# Patient Record
Sex: Female | Born: 1950
Health system: Southern US, Community
[De-identification: ages and names within clinical notes are randomized; demographics above are authoritative.]

## PROBLEM LIST (undated history)

## (undated) DIAGNOSIS — E785 Hyperlipidemia, unspecified: Secondary | ICD-10-CM

## (undated) DIAGNOSIS — F32A Depression, unspecified: Secondary | ICD-10-CM

## (undated) DIAGNOSIS — N951 Menopausal and female climacteric states: Secondary | ICD-10-CM

## (undated) DIAGNOSIS — F329 Major depressive disorder, single episode, unspecified: Secondary | ICD-10-CM

## (undated) DIAGNOSIS — F419 Anxiety disorder, unspecified: Secondary | ICD-10-CM

## (undated) DIAGNOSIS — I82409 Acute embolism and thrombosis of unspecified deep veins of unspecified lower extremity: Secondary | ICD-10-CM

## (undated) HISTORY — DX: Acute embolism and thrombosis of unspecified deep veins of unspecified lower extremity: I82.409

## (undated) HISTORY — DX: Depression, unspecified: F32.A

## (undated) HISTORY — DX: Hyperlipidemia, unspecified: E78.5

## (undated) HISTORY — PX: ABDOMINAL HYSTERECTOMY: SHX81

## (undated) HISTORY — PX: CYSTECTOMY: SUR359

## (undated) HISTORY — DX: Menopausal and female climacteric states: N95.1

## (undated) HISTORY — DX: Anxiety disorder, unspecified: F41.9

## (undated) HISTORY — DX: Major depressive disorder, single episode, unspecified: F32.9

## (undated) HISTORY — PX: LAPAROSCOPIC OVARIAN CYSTECTOMY: SUR786

---

## 2003-05-10 HISTORY — PX: COLONOSCOPY: SHX174

## 2005-10-25 ENCOUNTER — Ambulatory Visit: Payer: Self-pay | Admitting: Family Medicine

## 2005-12-05 ENCOUNTER — Ambulatory Visit: Payer: Self-pay | Admitting: Family Medicine

## 2005-12-06 ENCOUNTER — Ambulatory Visit: Payer: Self-pay | Admitting: Family Medicine

## 2006-01-03 ENCOUNTER — Ambulatory Visit: Payer: Self-pay | Admitting: Family Medicine

## 2006-01-05 ENCOUNTER — Ambulatory Visit: Payer: Self-pay | Admitting: *Deleted

## 2006-02-27 ENCOUNTER — Ambulatory Visit: Payer: Self-pay | Admitting: Family Medicine

## 2006-04-11 ENCOUNTER — Ambulatory Visit: Payer: Self-pay | Admitting: Family Medicine

## 2006-08-24 ENCOUNTER — Encounter (INDEPENDENT_AMBULATORY_CARE_PROVIDER_SITE_OTHER): Payer: Self-pay | Admitting: Family Medicine

## 2006-08-24 ENCOUNTER — Ambulatory Visit: Payer: Self-pay | Admitting: Family Medicine

## 2007-01-24 ENCOUNTER — Encounter (INDEPENDENT_AMBULATORY_CARE_PROVIDER_SITE_OTHER): Payer: Self-pay | Admitting: *Deleted

## 2008-03-25 ENCOUNTER — Encounter: Payer: Self-pay | Admitting: Internal Medicine

## 2008-06-05 ENCOUNTER — Ambulatory Visit: Payer: Self-pay | Admitting: Internal Medicine

## 2008-06-05 DIAGNOSIS — E785 Hyperlipidemia, unspecified: Secondary | ICD-10-CM

## 2008-06-05 DIAGNOSIS — E1169 Type 2 diabetes mellitus with other specified complication: Secondary | ICD-10-CM | POA: Insufficient documentation

## 2008-06-05 DIAGNOSIS — N951 Menopausal and female climacteric states: Secondary | ICD-10-CM

## 2008-06-05 HISTORY — DX: Menopausal and female climacteric states: N95.1

## 2008-06-05 HISTORY — DX: Hyperlipidemia, unspecified: E78.5

## 2008-06-27 ENCOUNTER — Encounter: Admission: RE | Admit: 2008-06-27 | Discharge: 2008-06-27 | Payer: Self-pay | Admitting: Internal Medicine

## 2008-07-01 ENCOUNTER — Encounter: Payer: Self-pay | Admitting: Internal Medicine

## 2008-07-02 ENCOUNTER — Encounter: Admission: RE | Admit: 2008-07-02 | Discharge: 2008-07-02 | Payer: Self-pay | Admitting: Internal Medicine

## 2009-04-13 ENCOUNTER — Encounter: Payer: Self-pay | Admitting: Internal Medicine

## 2009-06-30 ENCOUNTER — Encounter: Admission: RE | Admit: 2009-06-30 | Discharge: 2009-06-30 | Payer: Self-pay | Admitting: Internal Medicine

## 2009-07-06 ENCOUNTER — Ambulatory Visit: Payer: Self-pay | Admitting: Internal Medicine

## 2009-07-10 ENCOUNTER — Encounter: Payer: Self-pay | Admitting: Internal Medicine

## 2009-07-14 ENCOUNTER — Encounter: Admission: RE | Admit: 2009-07-14 | Discharge: 2009-07-14 | Payer: Self-pay | Admitting: Internal Medicine

## 2009-07-30 ENCOUNTER — Encounter: Payer: Self-pay | Admitting: Internal Medicine

## 2010-06-08 NOTE — Assessment & Plan Note (Signed)
Summary: cpx/cjr   Vital Signs:  Patient profile:   60 year old female Height:      66.75 inches Weight:      211 pounds BMI:     33.42 Temp:     98.6 degrees F oral BP sitting:   120 / 80  (right arm) Cuff size:   large  Vitals Entered By: Duard Brady LPN (July 06, 2009 9:31 AM) CC: cpx - c/o (l) thumb pain and (l) heel pain , colo 2006 , previous hysto - no Gyn Is Patient Diabetic? No   CC:  cpx - c/o (l) thumb pain and (l) heel pain , colo 2006 , and previous hysto - no Gyn.  History of Present Illness: 60 year old patient who is seen today for a preventative health examination.  She has a history of mild dyslipidemia, and lipid profiles are followed closely at work.  Total cholesterol has ranged from near-normal to the 250 range.  She denies any cardiopulmonary complaints.  She did have colonoscopy in 2005.  She receives annual mammograms.  Preventive Screening-Counseling & Management  Alcohol-Tobacco     Smoking Status: never  Allergies (verified): No Known Drug Allergies  Past History:  Past Medical History: Reviewed history from 06/05/2008 and no changes required. Hyperlipidemia  Past Surgical History: Reviewed history from 06/05/2008 and no changes required. Hysterectomy 1983 gravida two, para two, abortus zero- 1976, 1983   colonoscopy 2005  Family History: Reviewed history from 06/05/2008 and no changes required. father died in his mid 43s of coronary artery disease mother died in her mid 48s.  Complications of senile dementia  Six sisters two brothers- two sisters died of complications of lung cancer  Social History: Reviewed history from 06/05/2008 and no changes required. Divorced Never Smoked  Review of Systems  The patient denies anorexia, fever, weight loss, weight gain, vision loss, decreased hearing, hoarseness, chest pain, syncope, dyspnea on exertion, peripheral edema, prolonged cough, headaches, hemoptysis, abdominal pain,  melena, hematochezia, severe indigestion/heartburn, hematuria, incontinence, genital sores, muscle weakness, suspicious skin lesions, transient blindness, difficulty walking, depression, unusual weight change, abnormal bleeding, enlarged lymph nodes, angioedema, and breast masses.    Physical Exam  General:  overweight-appearing.  122/78overweight-appearing.   Head:  Normocephalic and atraumatic without obvious abnormalities. No apparent alopecia or balding. Eyes:  No corneal or conjunctival inflammation noted. EOMI. Perrla. Funduscopic exam benign, without hemorrhages, exudates or papilledema. Vision grossly normal. Ears:  External ear exam shows no significant lesions or deformities.  Otoscopic examination reveals clear canals, tympanic membranes are intact bilaterally without bulging, retraction, inflammation or discharge. Hearing is grossly normal bilaterally. Mouth:  Oral mucosa and oropharynx without lesions or exudates.  Teeth in good repair. Neck:  No deformities, masses, or tenderness noted. Chest Wall:  No deformities, masses, or tenderness noted. Breasts:  No mass, nodules, thickening, tenderness, bulging, retraction, inflamation, nipple discharge or skin changes noted.   Lungs:  Normal respiratory effort, chest expands symmetrically. Lungs are clear to auscultation, no crackles or wheezes. Heart:  Normal rate and regular rhythm. S1 and S2 normal without gallop, murmur, click, rub or other extra sounds. Abdomen:  Bowel sounds positive,abdomen soft and non-tender without masses, organomegaly or hernias noted. Rectal:  No external abnormalities noted. Normal sphincter tone. No rectal masses or tenderness. Genitalia:  status post hysterectomy Msk:  No deformity or scoliosis noted of thoracic or lumbar spine.   Pulses:  R and L carotid,radial,femoral,dorsalis pedis and posterior tibial pulses are full and equal bilaterally Extremities:  No clubbing, cyanosis, edema, or deformity noted with  normal full range of motion of all joints.   Neurologic:  No cranial nerve deficits noted. Station and gait are normal. Plantar reflexes are down-going bilaterally. DTRs are symmetrical throughout. Sensory, motor and coordinative functions appear intact. Skin:  Intact without suspicious lesions or rashes Cervical Nodes:  No lymphadenopathy noted Axillary Nodes:  No palpable lymphadenopathy Inguinal Nodes:  No significant adenopathy Psych:  Cognition and judgment appear intact. Alert and cooperative with normal attention span and concentration. No apparent delusions, illusions, hallucinations   Impression & Recommendations:  Problem # 1:  PREVENTIVE HEALTH CARE (ICD-V70.0)  Patient Instructions: 1)  Limit your Sodium (Salt). 2)  It is important that you exercise regularly at least 20 minutes 5 times a week. If you develop chest pain, have severe difficulty breathing, or feel very tired , stop exercising immediately and seek medical attention. 3)  You need to lose weight. Consider a lower calorie diet and regular exercise.  4)  Take calcium +Vitamin D daily. 5)  Please schedule a follow-up appointment in 1 year.

## 2010-06-08 NOTE — Letter (Signed)
Summary: Health Screening Report  Health Screening Report   Imported By: Maryln Gottron 07/07/2009 13:45:16  _____________________________________________________________________  External Attachment:    Type:   Image     Comment:   External Document

## 2010-07-06 ENCOUNTER — Other Ambulatory Visit: Payer: Self-pay | Admitting: Internal Medicine

## 2010-07-07 ENCOUNTER — Ambulatory Visit (INDEPENDENT_AMBULATORY_CARE_PROVIDER_SITE_OTHER): Payer: BC Managed Care – PPO | Admitting: Internal Medicine

## 2010-07-07 ENCOUNTER — Other Ambulatory Visit: Payer: Self-pay | Admitting: Internal Medicine

## 2010-07-07 ENCOUNTER — Encounter: Payer: Self-pay | Admitting: Internal Medicine

## 2010-07-07 VITALS — BP 122/80 | HR 80 | Temp 98.5°F | Resp 16 | Ht 67.0 in | Wt 209.0 lb

## 2010-07-07 DIAGNOSIS — E785 Hyperlipidemia, unspecified: Secondary | ICD-10-CM

## 2010-07-07 DIAGNOSIS — N951 Menopausal and female climacteric states: Secondary | ICD-10-CM

## 2010-07-07 DIAGNOSIS — Z0000 Encounter for general adult medical examination without abnormal findings: Secondary | ICD-10-CM

## 2010-07-07 NOTE — Progress Notes (Signed)
Subjective:    Patient ID: Danielle Morse, female    DOB: [birth year], 60 y.o.   MRN: [id number]  HPI   60 year old patient who is seen today for a preventive health examination. She is doing quite well and is on no chronic medications. She did have recent laboratory studies including a lipid profile at work that was all fairly unremarkable she had a elevated total cholesterol of 220 fasting blood sugar was slightly over 100 hemoglobin A1c slightly abnormal at 5.7. She had a screening colonoscopy in 2005  Past Medical History  Diagnosis Date  . HYPERLIPIDEMIA 06/05/2008  . MENOPAUSAL SYNDROME 06/05/2008   Past Surgical History  Procedure Date  . Abdominal hysterectomy   . Colonoscopy 2005    reports that she has never smoked. She does not have any smokeless tobacco history on file. Her alcohol and drug histories not on file. family history includes Cancer in her sister and Heart disease in her father.       Review of Systems  Constitutional: Negative for fever, appetite change, fatigue and unexpected weight change.  HENT: Negative for hearing loss, ear pain, nosebleeds, congestion, sore throat, mouth sores, trouble swallowing, neck stiffness, dental problem, voice change, sinus pressure and tinnitus.   Eyes: Negative for photophobia, pain, redness and visual disturbance.  Respiratory: Negative for cough, chest tightness and shortness of breath.   Cardiovascular: Negative for chest pain, palpitations and leg swelling.  Gastrointestinal: Negative for nausea, vomiting, abdominal pain, diarrhea, constipation, blood in stool, abdominal distention and rectal pain.  Genitourinary: Negative for dysuria, urgency, frequency, hematuria, flank pain, vaginal bleeding, vaginal discharge, difficulty urinating, genital sores, vaginal pain, menstrual problem and pelvic pain.  Musculoskeletal: Negative for back pain and arthralgias.  Skin: Negative for rash.  Neurological: Negative for dizziness,  syncope, speech difficulty, weakness, light-headedness, numbness and headaches.  Hematological: Negative for adenopathy. Does not bruise/bleed easily.  Psychiatric/Behavioral: Negative for suicidal ideas, behavioral problems, self-injury, dysphoric mood and agitation. The patient is not nervous/anxious.        Objective:   Physical Exam  Constitutional: She is oriented to person, place, and time. She appears well-developed and well-nourished.  HENT:  Head: Normocephalic and atraumatic.  Right Ear: External ear normal.  Left Ear: External ear normal.  Mouth/Throat: Oropharynx is clear and moist.  Eyes: Conjunctivae and EOM are normal.  Neck: Normal range of motion. Neck supple. No JVD present. No thyromegaly present.  Cardiovascular: Normal rate, regular rhythm, normal heart sounds and intact distal pulses.   No murmur heard. Pulmonary/Chest: Effort normal and breath sounds normal. She has no wheezes. She has no rales.  Abdominal: Soft. Bowel sounds are normal. She exhibits no distension and no mass. There is no tenderness. There is no rebound and no guarding.  Genitourinary: Vagina normal.        Status post hysterectomy  Musculoskeletal: Normal range of motion. She exhibits no edema and no tenderness.  Neurological: She is alert and oriented to person, place, and time. She has normal reflexes. No cranial nerve deficit. She exhibits normal muscle tone. Coordination normal.  Skin: Skin is warm and dry. No rash noted.  Psychiatric: She has a normal mood and affect. Her behavior is normal.          Assessment & Plan:   unremarkable clinical exam  Mild dyslipidemia  Impaired glucose tolerance   Exercise weight loss regimen discussed and encouraged   Recheck in one year  Mammogram bone density will be scheduled

## 2010-07-07 NOTE — Patient Instructions (Signed)
It is important that you exercise regularly, at least 20 minutes 3 to 4 times per week.  If you develop chest pain or shortness of breath seek  medical attention.   You need to lose weight.  Consider a lower calorie diet and regular exercise.  Take a calcium supplement, plus [phone] units of vitamin D  Return in one year for follow-up  Schedule your mammogram.

## 2010-07-27 ENCOUNTER — Other Ambulatory Visit: Payer: Self-pay | Admitting: Internal Medicine

## 2010-07-27 DIAGNOSIS — Z780 Asymptomatic menopausal state: Secondary | ICD-10-CM

## 2010-07-27 DIAGNOSIS — Z1231 Encounter for screening mammogram for malignant neoplasm of breast: Secondary | ICD-10-CM

## 2010-08-06 ENCOUNTER — Ambulatory Visit
Admission: RE | Admit: 2010-08-06 | Discharge: 2010-08-06 | Disposition: A | Discharge status: Home / Self Care | Payer: BC Managed Care – PPO | Source: Ambulatory Visit | Attending: Internal Medicine | Admitting: Internal Medicine

## 2010-08-06 DIAGNOSIS — Z780 Asymptomatic menopausal state: Secondary | ICD-10-CM

## 2010-08-06 DIAGNOSIS — Z1231 Encounter for screening mammogram for malignant neoplasm of breast: Secondary | ICD-10-CM

## 2010-08-27 ENCOUNTER — Encounter: Payer: Self-pay | Admitting: Internal Medicine

## 2011-06-29 ENCOUNTER — Other Ambulatory Visit: Payer: Self-pay | Admitting: Internal Medicine

## 2011-06-29 DIAGNOSIS — Z1231 Encounter for screening mammogram for malignant neoplasm of breast: Secondary | ICD-10-CM

## 2011-07-19 ENCOUNTER — Encounter: Payer: BC Managed Care – PPO | Admitting: Internal Medicine

## 2011-07-22 ENCOUNTER — Other Ambulatory Visit (INDEPENDENT_AMBULATORY_CARE_PROVIDER_SITE_OTHER): Payer: BC Managed Care – PPO

## 2011-07-22 DIAGNOSIS — Z0000 Encounter for general adult medical examination without abnormal findings: Secondary | ICD-10-CM

## 2011-07-22 LAB — CBC WITH DIFFERENTIAL/PLATELET
Eosinophils Relative: 3.1 % (ref 0.0–5.0)
HCT: 37.3 % (ref 36.0–46.0)
Lymphocytes Relative: 48.4 % — ABNORMAL HIGH (ref 12.0–46.0)
MCHC: 33.5 g/dL (ref 30.0–36.0)
MCV: 90.7 fl (ref 78.0–100.0)
Monocytes Absolute: 0.4 10*3/uL (ref 0.1–1.0)
Neutro Abs: 1.8 10*3/uL (ref 1.4–7.7)
Platelets: 287 10*3/uL (ref 150.0–400.0)
RBC: 4.11 Mil/uL (ref 3.87–5.11)
RDW: 13.4 % (ref 11.5–14.6)

## 2011-07-22 LAB — HEPATIC FUNCTION PANEL
ALT: 24 U/L (ref 0–35)
Albumin: 4.6 g/dL (ref 3.5–5.2)
Total Protein: 7.8 g/dL (ref 6.0–8.3)

## 2011-07-22 LAB — BASIC METABOLIC PANEL
Chloride: 100 mEq/L (ref 96–112)
GFR: 121.66 mL/min (ref >60.00)
Potassium: 3.2 mEq/L — ABNORMAL LOW (ref 3.5–5.1)

## 2011-07-22 LAB — LIPID PANEL
Cholesterol: 236 mg/dL — ABNORMAL HIGH (ref 0–200)
Triglycerides: 112 mg/dL (ref 0.0–149.0)
VLDL: 22.4 mg/dL (ref 0.0–40.0)

## 2011-07-22 LAB — TSH: TSH: 0.91 u[IU]/mL (ref 0.35–5.50)

## 2011-07-22 LAB — POCT URINALYSIS DIPSTICK

## 2011-07-29 ENCOUNTER — Ambulatory Visit (INDEPENDENT_AMBULATORY_CARE_PROVIDER_SITE_OTHER): Payer: BC Managed Care – PPO | Admitting: Internal Medicine

## 2011-07-29 ENCOUNTER — Encounter: Payer: Self-pay | Admitting: Internal Medicine

## 2011-07-29 VITALS — BP 120/80 | HR 74 | Temp 98.2°F | Resp 18 | Ht 67.0 in | Wt 206.0 lb

## 2011-07-29 DIAGNOSIS — Z0000 Encounter for general adult medical examination without abnormal findings: Secondary | ICD-10-CM

## 2011-07-29 DIAGNOSIS — E785 Hyperlipidemia, unspecified: Secondary | ICD-10-CM

## 2011-07-29 DIAGNOSIS — Z2911 Encounter for prophylactic immunotherapy for respiratory syncytial virus (RSV): Secondary | ICD-10-CM

## 2011-07-29 DIAGNOSIS — Z23 Encounter for immunization: Secondary | ICD-10-CM

## 2011-07-29 MED ORDER — HYDROCHLOROTHIAZIDE 25 MG PO TABS: 25.0000 mg | ORAL_TABLET | Freq: Every day | ORAL | Status: DC

## 2011-07-29 NOTE — Patient Instructions (Signed)
It is important that you exercise regularly, at least 20 minutes 3 to 4 times per week.  If you develop chest pain or shortness of breath seek  medical attention.  Limit your sodium (Salt) intake  You need to lose weight.  Consider a lower calorie diet and regular exercise.  Please check your blood pressure on a regular basis.  If it is consistently greater than 150/90, please make an office appointment.  Return in one year for follow-up

## 2011-07-29 NOTE — Progress Notes (Signed)
Subjective:    Patient ID: Danielle Morse, female    DOB: Apr 16, 1951, 61 y.o.   MRN: [id number]  HPI  History of Present Illness:   61 year-old patient who is seen today for a preventative health examination. She has a history of mild dyslipidemia, and lipid profiles are followed closely at work. Total cholesterol has ranged from near-normal to the 250 range. She denies any cardiopulmonary complaints. She did have colonoscopy in 2006. She receives annual mammograms.   Preventive Screening-Counseling & Management  Alcohol-Tobacco  Smoking Status: never   Allergies (verified):  No Known Drug Allergies  Past History:   Past Medical History:  Reviewed history from 06/05/2008 and no changes required.  Hyperlipidemia   Past Surgical History:  Reviewed history from 06/05/2008 and no changes required.  Hysterectomy 1983  gravida two, para two, abortus zero- 1976, 1983  colonoscopy 2006  Family History:  Reviewed history from 06/05/2008 and no changes required.  father died in his mid 36s of coronary artery disease (stepfather) mother died in her mid 37s. Complications of senile dementia  Six sisters two brothers- two sisters died of complications of lung cancer  And in the Social History:  Reviewed history from 06/05/2008 and no changes required.  Divorced  Never Smoked  Past Medical History  Diagnosis Date  . HYPERLIPIDEMIA 06/05/2008  . MENOPAUSAL SYNDROME 06/05/2008    History   Social History  . Marital Status: Single    Spouse Name: N/A    Number of Children: N/A  . Years of Education: N/A   Occupational History  . Not on file.   Social History Main Topics  . Smoking status: Never Smoker   . Smokeless tobacco: Never Used  . Alcohol Use: No  . Drug Use: No  . Sexually Active: Not on file   Other Topics Concern  . Not on file   Social History Narrative  . No narrative on file    Past Surgical History  Procedure Date  . Abdominal hysterectomy   .  Colonoscopy 2005    Family History  Problem Relation Age of Onset  . Heart disease Father   . Cancer Sister     No Known Allergies  No current outpatient prescriptions on file prior to visit.    BP 120/80  Pulse 74  Temp(Src) 98.2 F (36.8 C) (Oral)  Resp 18  Ht 5\' 7"  (1.702 m)  Wt 206 lb (93.441 kg)  BMI 32.26 kg/m2  SpO2 98%    Review of Systems  Constitutional: Negative.   HENT: Negative for hearing loss, congestion, sore throat, rhinorrhea, dental problem, sinus pressure and tinnitus.   Eyes: Negative for pain, discharge and visual disturbance.  Respiratory: Negative for cough and shortness of breath.   Cardiovascular: Negative for chest pain, palpitations and leg swelling.  Gastrointestinal: Negative for nausea, vomiting, abdominal pain, diarrhea, constipation, blood in stool and abdominal distention.  Genitourinary: Negative for dysuria, urgency, frequency, hematuria, flank pain, vaginal bleeding, vaginal discharge, difficulty urinating, vaginal pain and pelvic pain.  Musculoskeletal: Negative for joint swelling, arthralgias and gait problem.  Skin: Negative for rash.  Neurological: Negative for dizziness, syncope, speech difficulty, weakness, numbness and headaches.  Hematological: Negative for adenopathy.  Psychiatric/Behavioral: Negative for behavioral problems, dysphoric mood and agitation. The patient is not nervous/anxious.        Objective:   Physical Exam  Constitutional: She is oriented to person, place, and time. She appears well-developed and well-nourished.  HENT:  Head: Normocephalic and atraumatic.  Right Ear: External ear normal.  Left Ear: External ear normal.  Mouth/Throat: Oropharynx is clear and moist.  Eyes: Conjunctivae and EOM are normal.  Neck: Normal range of motion. Neck supple. No JVD present. No thyromegaly present.  Cardiovascular: Normal rate, regular rhythm, normal heart sounds and intact distal pulses.   No murmur  heard. Pulmonary/Chest: Effort normal and breath sounds normal. She has no wheezes. She has no rales.  Abdominal: Soft. Bowel sounds are normal. She exhibits no distension and no mass. There is no tenderness. There is no rebound and no guarding.  Genitourinary: Vagina normal. Guaiac negative stool.       Status post hysterectomy  Musculoskeletal: Normal range of motion. She exhibits no edema and no tenderness.  Neurological: She is alert and oriented to person, place, and time. She has normal reflexes. No cranial nerve deficit. She exhibits normal muscle tone. Coordination normal.  Skin: Skin is warm and dry. No rash noted.  Psychiatric: She has a normal mood and affect. Her behavior is normal.          Assessment & Plan:   Dyslipidemia. No weight loss encouraged Preventative health examination  Low salt diet exercise encouraged home blood pressure monitor and encouraged  Recheck one year

## 2011-08-10 ENCOUNTER — Ambulatory Visit
Admission: RE | Admit: 2011-08-10 | Discharge: 2011-08-10 | Disposition: A | Discharge status: Home / Self Care | Payer: BC Managed Care – PPO | Source: Ambulatory Visit | Attending: Internal Medicine | Admitting: Internal Medicine

## 2011-08-10 DIAGNOSIS — Z1231 Encounter for screening mammogram for malignant neoplasm of breast: Secondary | ICD-10-CM

## 2012-06-01 ENCOUNTER — Other Ambulatory Visit: Payer: Self-pay | Admitting: Internal Medicine

## 2012-06-01 DIAGNOSIS — Z1231 Encounter for screening mammogram for malignant neoplasm of breast: Secondary | ICD-10-CM

## 2012-08-01 ENCOUNTER — Other Ambulatory Visit (INDEPENDENT_AMBULATORY_CARE_PROVIDER_SITE_OTHER): Payer: BC Managed Care – PPO

## 2012-08-01 DIAGNOSIS — Z0000 Encounter for general adult medical examination without abnormal findings: Secondary | ICD-10-CM

## 2012-08-01 LAB — LIPID PANEL
Cholesterol: 200 mg/dL (ref 0–200)
HDL: 35.9 mg/dL — ABNORMAL LOW (ref >39.00)
Triglycerides: 128 mg/dL (ref 0.0–149.0)
VLDL: 25.6 mg/dL (ref 0.0–40.0)

## 2012-08-01 LAB — HEPATIC FUNCTION PANEL
ALT: 23 U/L (ref 0–35)
AST: 17 U/L (ref 0–37)
Alkaline Phosphatase: 81 U/L (ref 39–117)
Bilirubin, Direct: 0.1 mg/dL (ref 0.0–0.3)

## 2012-08-01 LAB — BASIC METABOLIC PANEL
BUN: 10 mg/dL (ref 6–23)
Calcium: 9.7 mg/dL (ref 8.4–10.5)
Chloride: 99 mEq/L (ref 96–112)
Glucose, Bld: 113 mg/dL — ABNORMAL HIGH (ref 70–99)
Potassium: 3.4 mEq/L — ABNORMAL LOW (ref 3.5–5.1)
Sodium: 139 mEq/L (ref 135–145)

## 2012-08-01 LAB — CBC WITH DIFFERENTIAL/PLATELET
Basophils Absolute: 0 10*3/uL (ref 0.0–0.1)
Basophils Relative: 0.6 % (ref 0.0–3.0)
Lymphs Abs: 2.3 10*3/uL (ref 0.7–4.0)
MCV: 88.4 fl (ref 78.0–100.0)
Monocytes Absolute: 0.4 10*3/uL (ref 0.1–1.0)
Neutro Abs: 2.9 10*3/uL (ref 1.4–7.7)
RDW: 14.8 % — ABNORMAL HIGH (ref 11.5–14.6)

## 2012-08-01 LAB — POCT URINALYSIS DIPSTICK
Protein, UA: NEGATIVE
Spec Grav, UA: 1.01
Urobilinogen, UA: 0.2
pH, UA: 7

## 2012-08-08 ENCOUNTER — Ambulatory Visit (INDEPENDENT_AMBULATORY_CARE_PROVIDER_SITE_OTHER): Payer: BC Managed Care – PPO | Admitting: Internal Medicine

## 2012-08-08 ENCOUNTER — Encounter: Payer: Self-pay | Admitting: Internal Medicine

## 2012-08-08 VITALS — BP 150/80 | HR 71 | Temp 98.7°F | Resp 18 | Ht 66.0 in | Wt 199.0 lb

## 2012-08-08 DIAGNOSIS — I10 Essential (primary) hypertension: Secondary | ICD-10-CM

## 2012-08-08 DIAGNOSIS — Z0000 Encounter for general adult medical examination without abnormal findings: Secondary | ICD-10-CM

## 2012-08-08 MED ORDER — HYDROCHLOROTHIAZIDE 25 MG PO TABS: 25.0000 mg | ORAL_TABLET | Freq: Every day | ORAL | Status: DC

## 2012-08-08 NOTE — Progress Notes (Signed)
Subjective:    Patient ID: Danielle Morse, female    DOB: 14-Jul-1950, 62 y.o.   MRN: [id number]  HPI  Wt Readings from Last 3 Encounters:  08/08/12 199 lb (90.266 kg)  07/29/11 206 lb (93.441 kg)  07/07/10 209 lb (94.802 kg)    Subjective:    Patient ID: Danielle Morse, female    DOB: [birth year], 62 y.o.   MRN: [id number]  HPI  History of Present Illness:   62  year-old patient who is seen today for a preventative health examination. She has a history of mild dyslipidemia, and lipid profiles are followed closely at work. Total cholesterol has ranged from near-normal to the 250 range. She denies any cardiopulmonary complaints. She did have colonoscopy in 2006. She receives annual mammograms.   Preventive Screening-Counseling & Management  Alcohol-Tobacco  Smoking Status: never   Allergies (verified):  No Known Drug Allergies  Past History:   Past Medical History:  Reviewed history from 06/05/2008 and no changes required.  Hyperlipidemia   Past Surgical History:  Reviewed history from 06/05/2008 and no changes required.  Hysterectomy 1983  gravida two, para two, abortus zero- 1976, 1983  colonoscopy 2006  Family History:  Reviewed history from 06/05/2008 and no changes required.  father died in his mid 69s of coronary artery disease (stepfather) mother died in her mid 84s. Complications of senile dementia  Six sisters two brothers- two sisters died of complications of lung cancer  And in the Social History:  Reviewed history from 06/05/2008 and no changes required.  Divorced  Never Smoked  Past Medical History  Diagnosis Date  . HYPERLIPIDEMIA 06/05/2008  . MENOPAUSAL SYNDROME 06/05/2008    History   Social History  . Marital Status: Single    Spouse Name: N/A    Number of Children: N/A  . Years of Education: N/A   Occupational History  . Not on file.   Social History Main Topics  . Smoking status: Never Smoker   . Smokeless tobacco: Never Used  .  Alcohol Use: No  . Drug Use: No  . Sexually Active: Not on file   Other Topics Concern  . Not on file   Social History Narrative  . No narrative on file    Past Surgical History  Procedure Date  . Abdominal hysterectomy   . Colonoscopy 2005    Family History  Problem Relation Age of Onset  . Heart disease Father   . Cancer Sister     No Known Allergies  No current outpatient prescriptions on file prior to visit.    BP 120/80  Pulse 74  Temp(Src) 98.2 F (36.8 C) (Oral)  Resp 18  Ht 5\' 7"  (1.702 m)  Wt 206 lb (93.441 kg)  BMI 32.26 kg/m2  SpO2 98%    Review of Systems  Constitutional: Negative.   HENT: Negative for hearing loss, congestion, sore throat, rhinorrhea, dental problem, sinus pressure and tinnitus.   Eyes: Negative for pain, discharge and visual disturbance.  Respiratory: Negative for cough and shortness of breath.   Cardiovascular: Negative for chest pain, palpitations and leg swelling.  Gastrointestinal: Negative for nausea, vomiting, abdominal pain, diarrhea, constipation, blood in stool and abdominal distention.  Genitourinary: Negative for dysuria, urgency, frequency, hematuria, flank pain, vaginal bleeding, vaginal discharge, difficulty urinating, vaginal pain and pelvic pain.  Musculoskeletal: Negative for joint swelling, arthralgias and gait problem.  Skin: Negative for rash.  Neurological: Negative for dizziness, syncope, speech difficulty, weakness, numbness and headaches.  Hematological: Negative for  adenopathy.  Psychiatric/Behavioral: Negative for behavioral problems, dysphoric mood and agitation. The patient is not nervous/anxious.        Objective:   Physical Exam  Constitutional: She is oriented to person, place, and time. She appears well-developed and well-nourished.  HENT:  Head: Normocephalic and atraumatic.  Right Ear: External ear normal.  Left Ear: External ear normal.  Mouth/Throat: Oropharynx is clear and moist.  Eyes:  Conjunctivae and EOM are normal.  Neck: Normal range of motion. Neck supple. No JVD present. No thyromegaly present.  Cardiovascular: Normal rate, regular rhythm, normal heart sounds and intact distal pulses.   No murmur heard. Pulmonary/Chest: Effort normal and breath sounds normal. She has no wheezes. She has no rales.  Abdominal: Soft. Bowel sounds are normal. She exhibits no distension and no mass. There is no tenderness. There is no rebound and no guarding.  Genitourinary: Vagina normal. Guaiac negative stool.       Status post hysterectomy  Musculoskeletal: Normal range of motion. She exhibits no edema and no tenderness.  Neurological: She is alert and oriented to person, place, and time. She has normal reflexes. No cranial nerve deficit. She exhibits normal muscle tone. Coordination normal.  Skin: Skin is warm and dry. No rash noted.  Psychiatric: She has a normal mood and affect. Her behavior is normal.          Assessment & Plan:   Dyslipidemia. No weight loss encouraged Preventative health examination  Low salt diet exercise encouraged home blood pressure monitor and encouraged  Recheck one year and Review of Systems     Objective:   Physical Exam        Assessment & Plan:

## 2012-08-08 NOTE — Patient Instructions (Signed)

## 2012-08-13 ENCOUNTER — Ambulatory Visit
Admission: RE | Admit: 2012-08-13 | Discharge: 2012-08-13 | Disposition: A | Discharge status: Home / Self Care | Payer: BC Managed Care – PPO | Source: Ambulatory Visit | Attending: Internal Medicine | Admitting: Internal Medicine

## 2012-08-13 DIAGNOSIS — Z1231 Encounter for screening mammogram for malignant neoplasm of breast: Secondary | ICD-10-CM

## 2013-07-08 ENCOUNTER — Other Ambulatory Visit: Payer: Self-pay

## 2013-07-08 DIAGNOSIS — Z1231 Encounter for screening mammogram for malignant neoplasm of breast: Secondary | ICD-10-CM

## 2013-08-14 ENCOUNTER — Ambulatory Visit
Admission: RE | Admit: 2013-08-14 | Discharge: 2013-08-14 | Disposition: A | Discharge status: Home / Self Care | Payer: BC Managed Care – PPO | Source: Ambulatory Visit

## 2013-08-14 DIAGNOSIS — Z1231 Encounter for screening mammogram for malignant neoplasm of breast: Secondary | ICD-10-CM

## 2013-08-15 ENCOUNTER — Other Ambulatory Visit (INDEPENDENT_AMBULATORY_CARE_PROVIDER_SITE_OTHER): Payer: BC Managed Care – PPO

## 2013-08-15 DIAGNOSIS — Z0000 Encounter for general adult medical examination without abnormal findings: Secondary | ICD-10-CM

## 2013-08-15 LAB — CBC WITH DIFFERENTIAL/PLATELET
Basophils Absolute: 0 10*3/uL (ref 0.0–0.1)
Basophils Relative: 0.6 % (ref 0.0–3.0)
EOS ABS: 0.1 10*3/uL (ref 0.0–0.7)
EOS PCT: 1.7 % (ref 0.0–5.0)
HEMATOCRIT: 37.9 % (ref 36.0–46.0)
Hemoglobin: 12.7 g/dL (ref 12.0–15.0)
LYMPHS ABS: 2.5 10*3/uL (ref 0.7–4.0)
Lymphocytes Relative: 47.2 % — ABNORMAL HIGH (ref 12.0–46.0)
MCHC: 33.6 g/dL (ref 30.0–36.0)
MCV: 89.7 fl (ref 78.0–100.0)
MONO ABS: 0.4 10*3/uL (ref 0.1–1.0)
Monocytes Relative: 6.8 % (ref 3.0–12.0)
Neutro Abs: 2.3 10*3/uL (ref 1.4–7.7)
Neutrophils Relative %: 43.7 % (ref 43.0–77.0)
PLATELETS: 327 10*3/uL (ref 150.0–400.0)
RBC: 4.23 Mil/uL (ref 3.87–5.11)
RDW: 13.9 % (ref 11.5–14.6)
WBC: 5.4 10*3/uL (ref 4.5–10.5)

## 2013-08-15 LAB — BASIC METABOLIC PANEL
BUN: 12 mg/dL (ref 6–23)
CALCIUM: 9.5 mg/dL (ref 8.4–10.5)
CO2: 31 mEq/L (ref 19–32)
Chloride: 97 mEq/L (ref 96–112)
Creatinine, Ser: 0.6 mg/dL (ref 0.4–1.2)
GFR: 132.72 mL/min (ref >60.00)
GLUCOSE: 92 mg/dL (ref 70–99)
Potassium: 3.3 mEq/L — ABNORMAL LOW (ref 3.5–5.1)
SODIUM: 137 meq/L (ref 135–145)

## 2013-08-15 LAB — LIPID PANEL
Cholesterol: 220 mg/dL — ABNORMAL HIGH (ref 0–200)
HDL: 42.2 mg/dL (ref >39.00)
LDL Cholesterol: 151 mg/dL — ABNORMAL HIGH (ref 0–99)
Total CHOL/HDL Ratio: 5
Triglycerides: 133 mg/dL (ref 0.0–149.0)
VLDL: 26.6 mg/dL (ref 0.0–40.0)

## 2013-08-15 LAB — POCT URINALYSIS DIPSTICK
BILIRUBIN UA: NEGATIVE
Glucose, UA: NEGATIVE
KETONES UA: NEGATIVE
LEUKOCYTES UA: NEGATIVE
Nitrite, UA: NEGATIVE
Protein, UA: NEGATIVE
Spec Grav, UA: <=1.005
Urobilinogen, UA: 0.2
pH, UA: 6

## 2013-08-15 LAB — HEPATIC FUNCTION PANEL
ALT: 20 U/L (ref 0–35)
AST: 16 U/L (ref 0–37)
Albumin: 4.5 g/dL (ref 3.5–5.2)
Alkaline Phosphatase: 69 U/L (ref 39–117)
BILIRUBIN TOTAL: 0.7 mg/dL (ref 0.3–1.2)
Bilirubin, Direct: 0 mg/dL (ref 0.0–0.3)
Total Protein: 7.5 g/dL (ref 6.0–8.3)

## 2013-08-15 LAB — TSH: TSH: 0.33 u[IU]/mL — ABNORMAL LOW (ref 0.35–5.50)

## 2013-08-22 ENCOUNTER — Ambulatory Visit (INDEPENDENT_AMBULATORY_CARE_PROVIDER_SITE_OTHER): Payer: BC Managed Care – PPO | Admitting: Internal Medicine

## 2013-08-22 ENCOUNTER — Encounter: Payer: Self-pay | Admitting: Internal Medicine

## 2013-08-22 VITALS — BP 130/80

## 2013-08-22 DIAGNOSIS — E785 Hyperlipidemia, unspecified: Secondary | ICD-10-CM

## 2013-08-22 DIAGNOSIS — Z1211 Encounter for screening for malignant neoplasm of colon: Secondary | ICD-10-CM

## 2013-08-22 DIAGNOSIS — Z0000 Encounter for general adult medical examination without abnormal findings: Secondary | ICD-10-CM

## 2013-08-22 DIAGNOSIS — N951 Menopausal and female climacteric states: Secondary | ICD-10-CM

## 2013-08-22 MED ORDER — HYDROCHLOROTHIAZIDE 25 MG PO TABS: 25.0000 mg | ORAL_TABLET | Freq: Every day | ORAL | Status: DC

## 2013-08-22 NOTE — Progress Notes (Signed)
Pre-visit discussion using our clinic review tool. No additional management support is needed unless otherwise documented below in the visit note.  

## 2013-08-22 NOTE — Patient Instructions (Signed)
Please check your blood pressure on a regular basis.  If it is consistently greater than 150/90, please make an office appointment.  Limit your sodium (Salt) intake    It is important that you exercise regularly, at least 20 minutes 3 to 4 times per week.  If you develop chest pain or shortness of breath seek  medical attention.  You need to lose weight.  Consider a lower calorie diet and regular exercise.

## 2013-08-22 NOTE — Progress Notes (Signed)
Subjective:    Patient ID: Sapphyre Monasmith, female    DOB: [birth year], 63 y.o.   MRN: [id number]  HPI   Wt Readings from Last 3 Encounters:  08/08/12 199 lb (90.266 kg)  07/29/11 206 lb (93.441 kg)  07/07/10 209 lb (94.802 kg)    Subjective:    Patient ID: Gracy Bruins, female    DOB: June 11, 1950, 63 y.o.   MRN: [id number]  HPI  History of Present Illness:   63   year-old patient who is seen today for a preventative health examination. She has a history of mild dyslipidemia, and lipid profiles are followed closely at work. Total cholesterol has ranged from near-normal to the 250 range. She denies any cardiopulmonary complaints. She did have colonoscopy in 2006. She receives annual mammograms.   Preventive Screening-Counseling & Management  Alcohol-Tobacco  Smoking Status: never   Allergies (verified):  No Known Drug Allergies  Past History:   Past Medical History:   Hyperlipidemia   Past Surgical History:   Hysterectomy 1983  gravida two, para two, abortus zero- 1976, 1983  colonoscopy 2006  Family History:   father died in his mid 74s of coronary artery disease (stepfather) mother died in her mid 20s. Complications of senile dementia  Six sisters two brothers- two sisters died of complications of lung cancer   Social History:   Divorced  Never Smoked  Past Medical History  Diagnosis Date  . HYPERLIPIDEMIA 06/05/2008  . MENOPAUSAL SYNDROME 06/05/2008    History   Social History  . Marital Status: Single    Spouse Name: N/A    Number of Children: N/A  . Years of Education: N/A   Occupational History  . Not on file.   Social History Main Topics  . Smoking status: Never Smoker   . Smokeless tobacco: Never Used  . Alcohol Use: No  . Drug Use: No  . Sexually Active: Not on file   Other Topics Concern  . Not on file   Social History Narrative  . No narrative on file    Past Surgical History  Procedure Date  . Abdominal hysterectomy   .  Colonoscopy 2005    Family History  Problem Relation Age of Onset  . Heart disease Father   . Cancer Sister     No Known Allergies  No current outpatient prescriptions on file prior to visit.    BP 120/80  Pulse 74  Temp(Src) 98.2 F (36.8 C) (Oral)  Resp 18  Ht 5\' 7"  (1.702 m)  Wt 206 lb (93.441 kg)  BMI 32.26 kg/m2  SpO2 98%    Review of Systems  Constitutional: Negative.   HENT: Negative for hearing loss, congestion, sore throat, rhinorrhea, dental problem, sinus pressure and tinnitus.   Eyes: Negative for pain, discharge and visual disturbance.  Respiratory: Negative for cough and shortness of breath.   Cardiovascular: Negative for chest pain, palpitations and leg swelling.  Gastrointestinal: Negative for nausea, vomiting, abdominal pain, diarrhea, constipation, blood in stool and abdominal distention.  Genitourinary: Negative for dysuria, urgency, frequency, hematuria, flank pain, vaginal bleeding, vaginal discharge, difficulty urinating, vaginal pain and pelvic pain.  Musculoskeletal: Negative for joint swelling, arthralgias and gait problem.  Skin: Negative for rash.  Neurological: Negative for dizziness, syncope, speech difficulty, weakness, numbness and headaches.  Hematological: Negative for adenopathy.  Psychiatric/Behavioral: Negative for behavioral problems, dysphoric mood and agitation. The patient is not nervous/anxious.        Objective:   Physical Exam  Constitutional: She is  oriented to person, place, and time. She appears well-developed and well-nourished.  HENT:  Head: Normocephalic and atraumatic.  Right Ear: External ear normal.  Left Ear: External ear normal.  Mouth/Throat: Oropharynx is clear and moist.  Eyes: Conjunctivae and EOM are normal.  Neck: Normal range of motion. Neck supple. No JVD present. No thyromegaly present.  Cardiovascular: Normal rate, regular rhythm, normal heart sounds and intact distal pulses.   No murmur  heard. Pulmonary/Chest: Effort normal and breath sounds normal. She has no wheezes. She has no rales.  Abdominal: Soft. Bowel sounds are normal. She exhibits no distension and no mass. There is no tenderness. There is no rebound and no guarding.  Musculoskeletal: Normal range of motion. She exhibits no edema and no tenderness.  Neurological: She is alert and oriented to person, place, and time. She has normal reflexes. No cranial nerve deficit. She exhibits normal muscle tone. Coordination normal.  Skin: Skin is warm and dry. No rash noted.  Psychiatric: She has a normal mood and affect. Her behavior is normal.          Assessment & Plan:   Dyslipidemia.  weight loss encouraged Preventative health examination  Low salt diet exercise encouraged home blood pressure monitor and encouraged  Recheck one year and Review of Systems  See above     Objective:   Physical Exam   See above      Assessment & Plan:  See above

## 2014-02-19 ENCOUNTER — Ambulatory Visit (INDEPENDENT_AMBULATORY_CARE_PROVIDER_SITE_OTHER): Payer: BC Managed Care – PPO

## 2014-02-19 DIAGNOSIS — Z23 Encounter for immunization: Secondary | ICD-10-CM

## 2014-03-28 ENCOUNTER — Telehealth: Payer: Self-pay | Admitting: Internal Medicine

## 2014-03-28 NOTE — Telephone Encounter (Signed)
Pt needed to see Dr Raliegh Ip  So I use the same day slot for Tuesday 04/01/14 at 11am pt said she was having vagina irritation and did not want to see no one else

## 2014-04-01 ENCOUNTER — Ambulatory Visit: Payer: BC Managed Care – PPO | Admitting: Internal Medicine

## 2014-04-02 ENCOUNTER — Ambulatory Visit (INDEPENDENT_AMBULATORY_CARE_PROVIDER_SITE_OTHER): Payer: BC Managed Care – PPO | Admitting: Internal Medicine

## 2014-04-02 ENCOUNTER — Encounter: Payer: Self-pay | Admitting: Internal Medicine

## 2014-04-02 ENCOUNTER — Telehealth: Payer: Self-pay | Admitting: Internal Medicine

## 2014-04-02 VITALS — BP 134/76 | HR 72 | Temp 98.1°F | Ht 66.0 in | Wt 197.0 lb

## 2014-04-02 DIAGNOSIS — N952 Postmenopausal atrophic vaginitis: Secondary | ICD-10-CM

## 2014-04-02 MED ORDER — ESTROGENS, CONJUGATED 0.625 MG/GM VA CREA: 1.0000 | TOPICAL_CREAM | Freq: Every day | VAGINAL | Status: DC

## 2014-04-02 NOTE — Progress Notes (Signed)
Subjective:    Patient ID: Danielle Morse, female    DOB: [birth year], 63 y.o.   MRN: [id number]  HPI  63 year old patient who presents with a several week history of what she describes as vaginal dryness.  She has some mild sense of irritation but no discharge or itching.  Past Medical History  Diagnosis Date  . HYPERLIPIDEMIA 06/05/2008  . MENOPAUSAL SYNDROME 06/05/2008    History   Social History  . Marital Status: Single    Spouse Name: N/A    Number of Children: N/A  . Years of Education: N/A   Occupational History  . Not on file.   Social History Main Topics  . Smoking status: Never Smoker   . Smokeless tobacco: Never Used  . Alcohol Use: No  . Drug Use: No  . Sexual Activity: Not on file   Other Topics Concern  . Not on file   Social History Narrative    Past Surgical History  Procedure Laterality Date  . Abdominal hysterectomy    . Colonoscopy  2005    Family History  Problem Relation Age of Onset  . Heart disease Father   . Cancer Sister     No Known Allergies  Current Outpatient Prescriptions on File Prior to Visit  Medication Sig Dispense Refill  . cholecalciferol (VITAMIN D) 1000 UNITS tablet Take 1,000 Units by mouth daily.    . hydrochlorothiazide (HYDRODIURIL) 25 MG tablet Take 1 tablet (25 mg total) by mouth daily. 90 tablet 3  . Multiple Vitamin (MULTIVITAMIN) tablet Take 1 tablet by mouth daily.     No current facility-administered medications on file prior to visit.    BP 134/76 mmHg  Pulse 72  Temp(Src) 98.1 F (36.7 C) (Oral)  Ht 5\' 6"  (1.676 m)  Wt 197 lb (89.359 kg)  BMI 31.81 kg/m2     Review of Systems  Genitourinary: Positive for vaginal pain.       Objective:   Physical Exam  Constitutional: She appears well-developed and well-nourished. No distress.  Genitourinary:  Vaginal exam performed revealed some atrophic changes only          Assessment & Plan:   Atrophic vaginitis.  Trial Premarin vaginal  cream daily Hypertension, stable

## 2014-04-02 NOTE — Telephone Encounter (Signed)
Pt stated with her ins the cost is over 200 dollars for conjugated estrogen vaginal cream. Pt would like a different medication call into [facility]. Pt stated medication does not come in generic

## 2014-04-02 NOTE — Progress Notes (Signed)
Pre visit review using our clinic review tool, if applicable. No additional management support is needed unless otherwise documented below in the visit note. 

## 2014-04-02 NOTE — Patient Instructions (Signed)

## 2014-04-07 NOTE — Telephone Encounter (Signed)
Please check with patient's pharmacy about the availability of any estrogen vaginal cream that is generic and call in a new prescription and notify patient

## 2014-04-07 NOTE — Telephone Encounter (Signed)
Please see message and advise 

## 2014-04-08 NOTE — Telephone Encounter (Signed)
Left message to call office with family member. 

## 2014-04-08 NOTE — Telephone Encounter (Signed)
Pt called back, told her estrogen vaginal creams do not come in generic. Told pt there is a tablet called Vagifem that comes generic and she can check with her insurance if it is covered and let me know. Pt verbalized understanding.

## 2014-04-09 NOTE — Telephone Encounter (Signed)
Pt called back and said her insurance will not cover anything until her deductible is met. Pt asked if there is something else she can use. Told pt she can try vegetable oil and Astroglide for lubrication during relations. Pt verbalized understanding.

## 2014-07-22 ENCOUNTER — Other Ambulatory Visit: Payer: Self-pay | Admitting: Internal Medicine

## 2014-07-22 ENCOUNTER — Other Ambulatory Visit: Payer: Self-pay

## 2014-07-22 DIAGNOSIS — Z1231 Encounter for screening mammogram for malignant neoplasm of breast: Secondary | ICD-10-CM

## 2014-08-18 ENCOUNTER — Ambulatory Visit
Admission: RE | Admit: 2014-08-18 | Discharge: 2014-08-18 | Disposition: A | Discharge status: Home / Self Care | Payer: BLUE CROSS/BLUE SHIELD | Source: Ambulatory Visit

## 2014-08-18 DIAGNOSIS — Z1231 Encounter for screening mammogram for malignant neoplasm of breast: Secondary | ICD-10-CM

## 2014-09-15 ENCOUNTER — Other Ambulatory Visit (INDEPENDENT_AMBULATORY_CARE_PROVIDER_SITE_OTHER): Payer: BLUE CROSS/BLUE SHIELD

## 2014-09-15 DIAGNOSIS — Z0000 Encounter for general adult medical examination without abnormal findings: Secondary | ICD-10-CM

## 2014-09-15 DIAGNOSIS — R7989 Other specified abnormal findings of blood chemistry: Secondary | ICD-10-CM | POA: Diagnosis not present

## 2014-09-15 LAB — BASIC METABOLIC PANEL
BUN: 11 mg/dL (ref 6–23)
CALCIUM: 9.5 mg/dL (ref 8.4–10.5)
CO2: 32 meq/L (ref 19–32)
CREATININE: 0.67 mg/dL (ref 0.40–1.20)
Chloride: 99 mEq/L (ref 96–112)
GFR: 114.21 mL/min (ref >60.00)
Glucose, Bld: 113 mg/dL — ABNORMAL HIGH (ref 70–99)
Potassium: 3.8 mEq/L (ref 3.5–5.1)
SODIUM: 137 meq/L (ref 135–145)

## 2014-09-15 LAB — CBC WITH DIFFERENTIAL/PLATELET
BASOS PCT: 0.8 % (ref 0.0–3.0)
Basophils Absolute: 0 10*3/uL (ref 0.0–0.1)
Eosinophils Absolute: 0.1 10*3/uL (ref 0.0–0.7)
Eosinophils Relative: 2.7 % (ref 0.0–5.0)
HCT: 38.5 % (ref 36.0–46.0)
HEMOGLOBIN: 13 g/dL (ref 12.0–15.0)
Lymphocytes Relative: 48.5 % — ABNORMAL HIGH (ref 12.0–46.0)
Lymphs Abs: 2.1 10*3/uL (ref 0.7–4.0)
MCHC: 33.9 g/dL (ref 30.0–36.0)
MCV: 88.3 fl (ref 78.0–100.0)
MONOS PCT: 7.7 % (ref 3.0–12.0)
Monocytes Absolute: 0.3 10*3/uL (ref 0.1–1.0)
NEUTROS PCT: 40.3 % — AB (ref 43.0–77.0)
Neutro Abs: 1.8 10*3/uL (ref 1.4–7.7)
PLATELETS: 328 10*3/uL (ref 150.0–400.0)
RBC: 4.35 Mil/uL (ref 3.87–5.11)
RDW: 14.2 % (ref 11.5–15.5)
WBC: 4.4 10*3/uL (ref 4.0–10.5)

## 2014-09-15 LAB — HEPATIC FUNCTION PANEL
ALT: 16 U/L (ref 0–35)
AST: 12 U/L (ref 0–37)
Albumin: 4.2 g/dL (ref 3.5–5.2)
Alkaline Phosphatase: 79 U/L (ref 39–117)
Bilirubin, Direct: 0.1 mg/dL (ref 0.0–0.3)
Total Bilirubin: 0.5 mg/dL (ref 0.2–1.2)
Total Protein: 7.2 g/dL (ref 6.0–8.3)

## 2014-09-15 LAB — POCT URINALYSIS DIPSTICK
Bilirubin, UA: NEGATIVE
Glucose, UA: NEGATIVE
KETONES UA: NEGATIVE
LEUKOCYTES UA: NEGATIVE
Nitrite, UA: NEGATIVE
PH UA: 6.5
PROTEIN UA: NEGATIVE
Spec Grav, UA: <=1.005
Urobilinogen, UA: 0.2

## 2014-09-15 LAB — LIPID PANEL
Cholesterol: 203 mg/dL — ABNORMAL HIGH (ref 0–200)
HDL: 34.2 mg/dL — ABNORMAL LOW (ref >39.00)
NonHDL: 168.8
TRIGLYCERIDES: 240 mg/dL — AB (ref 0.0–149.0)
Total CHOL/HDL Ratio: 6
VLDL: 48 mg/dL — ABNORMAL HIGH (ref 0.0–40.0)

## 2014-09-15 LAB — TSH: TSH: 1.05 u[IU]/mL (ref 0.35–4.50)

## 2014-09-15 LAB — LDL CHOLESTEROL, DIRECT: Direct LDL: 96 mg/dL

## 2014-09-22 ENCOUNTER — Encounter: Payer: Self-pay | Admitting: Internal Medicine

## 2014-09-22 ENCOUNTER — Ambulatory Visit (INDEPENDENT_AMBULATORY_CARE_PROVIDER_SITE_OTHER): Payer: BLUE CROSS/BLUE SHIELD | Admitting: Internal Medicine

## 2014-09-22 VITALS — BP 140/80 | HR 86 | Temp 98.0°F | Resp 20 | Ht 66.0 in | Wt 203.0 lb

## 2014-09-22 DIAGNOSIS — I10 Essential (primary) hypertension: Secondary | ICD-10-CM | POA: Diagnosis not present

## 2014-09-22 DIAGNOSIS — Z0000 Encounter for general adult medical examination without abnormal findings: Secondary | ICD-10-CM | POA: Diagnosis not present

## 2014-09-22 DIAGNOSIS — E785 Hyperlipidemia, unspecified: Secondary | ICD-10-CM

## 2014-09-22 MED ORDER — ESCITALOPRAM OXALATE 10 MG PO TABS: 10.0000 mg | ORAL_TABLET | Freq: Every day | ORAL | Status: DC

## 2014-09-22 MED ORDER — ALPRAZOLAM 0.25 MG PO TABS: 0.2500 mg | ORAL_TABLET | Freq: Two times a day (BID) | ORAL | Status: DC | PRN

## 2014-09-22 NOTE — Progress Notes (Signed)
Subjective:    Patient ID: Danielle Morse, female    DOB: 03-23-1951, 64 y.o.   MRN: [id number]  HPI   Wt Readings from Last 3 Encounters:  09/22/14 203 lb (92.08 kg)  04/02/14 197 lb (89.359 kg)  08/08/12 199 lb (90.266 kg)    Subjective:    Patient ID: Danielle Morse, female    DOB: 1950-07-14, 64 y.o.   MRN: [id number]  HPI  History of Present Illness:   64    year-old patient who is seen today for a preventative health examination. She has a history of mild dyslipidemia, and lipid profiles are followed closely at work. Total cholesterol has ranged from near-normal to the 250 range. She denies any cardiopulmonary complaints. She did have colonoscopy in 2006. She receives annual mammograms.   For the past 3 months she has been quite tearful and depressed.  No obvious triggers, although she did retire in July of last year and states that perhaps she retired to early.  Proximally 3 years ago.  She had a very similar episode of depression.  She was prescribed a short-term low-dose Xanax with benefit   Preventive Screening-Counseling & Management  Alcohol-Tobacco  Smoking Status: never   Allergies (verified):  No Known Drug Allergies  Past History:   Past Medical History:   Hyperlipidemia   Past Surgical History:   Hysterectomy 1983  gravida two, para two, abortus zero- 1976, 1983  colonoscopy 2006  Family History:   father died in his mid 70s of coronary artery disease (stepfather) mother died in her mid 34s. Complications of senile dementia  Six sisters two brothers- two sisters died of complications of lung cancer   Social History:   Divorced  Never Smoked  Past Medical History  Diagnosis Date  . HYPERLIPIDEMIA 06/05/2008  . MENOPAUSAL SYNDROME 06/05/2008    History   Social History  . Marital Status: Single    Spouse Name: N/A    Number of Children: N/A  . Years of Education: N/A   Occupational History  . Not on file.   Social History Main Topics   . Smoking status: Never Smoker   . Smokeless tobacco: Never Used  . Alcohol Use: No  . Drug Use: No  . Sexually Active: Not on file   Other Topics Concern  . Not on file   Social History Narrative  . No narrative on file    Past Surgical History  Procedure Date  . Abdominal hysterectomy   . Colonoscopy 2005    Family History  Problem Relation Age of Onset  . Heart disease Father   . Cancer Sister     No Known Allergies  No current outpatient prescriptions on file prior to visit.    BP 140/80  Pulse 74  Temp(Src) 98.2 F (36.8 C) (Oral)  Resp 18  Ht 5\' 7"  (1.702 m)  Wt 206 lb (93.441 kg)  BMI 32.26 kg/m2  SpO2 98%    Review of Systems  Constitutional: Negative.   HENT: Negative for hearing loss, congestion, sore throat, rhinorrhea, dental problem, sinus pressure and tinnitus.   Eyes: Negative for pain, discharge and visual disturbance.  Respiratory: Negative for cough and shortness of breath.   Cardiovascular: Negative for chest pain, palpitations and leg swelling.  Gastrointestinal: Negative for nausea, vomiting, abdominal pain, diarrhea, constipation, blood in stool and abdominal distention.  Genitourinary: Negative for dysuria, urgency, frequency, hematuria, flank pain, vaginal bleeding, vaginal discharge, difficulty urinating, vaginal pain and pelvic pain.  Musculoskeletal: Negative  for joint swelling, arthralgias and gait problem.  Skin: Negative for rash.  Neurological: Negative for dizziness, syncope, speech difficulty, weakness, numbness and headaches.  Hematological: Negative for adenopathy.  Psychiatric/Behavioral: Negative for behavioral problems, dysphoric mood and agitation. The patient is not nervous/anxious.        Objective:   Physical Exam  Constitutional: She is oriented to person, place, and time. She appears well-developed and well-nourished.  HENT:  Head: Normocephalic and atraumatic.  Right Ear: External ear normal.  Left Ear:  External ear normal.  Mouth/Throat: Oropharynx is clear and moist.  Eyes: Conjunctivae and EOM are normal.  Neck: Normal range of motion. Neck supple. No JVD present. No thyromegaly present.  Cardiovascular: Normal rate, regular rhythm, normal heart sounds and intact distal pulses.   No murmur heard. Pulmonary/Chest: Effort normal and breath sounds normal. She has no wheezes. She has no rales.  Abdominal: Soft. Bowel sounds are normal. She exhibits no distension and no mass. There is no tenderness. There is no rebound and no guarding.  Musculoskeletal: Normal range of motion. She exhibits no edema and no tenderness.  Neurological: She is alert and oriented to person, place, and time. She has normal reflexes. No cranial nerve deficit. She exhibits normal muscle tone. Coordination normal.  Skin: Skin is warm and dry. No rash noted.  Psychiatric: She has a normal mood and affect. Her behavior is normal.          Assessment & Plan:   Dyslipidemia.  weight loss encouraged Preventative health examination  Low salt diet exercise encouraged home blood pressure monitor and encouraged    Review of Systems  Psychiatric/Behavioral: Positive for dysphoric mood. The patient is nervous/anxious.    See above     Objective:   Physical Exam  Constitutional:  Blood pressure 140/80    See above      Assessment & Plan:  Preventive health exam Clinical depression.  Will start the Lexapro 10 mg daily.  We'll reassess in 6-8 weeks  We'll set up for 10 year colonoscopy

## 2014-09-22 NOTE — Progress Notes (Signed)
Pre visit review using our clinic review tool, if applicable. No additional management support is needed unless otherwise documented below in the visit note. 

## 2014-09-22 NOTE — Patient Instructions (Addendum)
Health Maintenance Adopting a healthy lifestyle and getting preventive care can go a long way to promote health and wellness. Talk with your health care provider about what schedule of regular examinations is right for you. This is a good chance for you to check in with your provider about disease prevention and staying healthy. In between checkups, there are plenty of things you can do on your own. Experts have done a lot of research about which lifestyle changes and preventive measures are most likely to keep you healthy. Ask your health care provider for more information. WEIGHT AND DIET  Eat a healthy diet 1. Be sure to include plenty of vegetables, fruits, low-fat dairy products, and lean protein. 2. Do not eat a lot of foods high in solid fats, added sugars, or salt. 3. Get regular exercise. This is one of the most important things you can do for your health. 1. Most adults should exercise for at least 150 minutes each week. The exercise should increase your heart rate and make you sweat (moderate-intensity exercise). 2. Most adults should also do strengthening exercises at least twice a week. This is in addition to the moderate-intensity exercise.  Maintain a healthy weight  Body mass index (BMI) is a measurement that can be used to identify possible weight problems. It estimates body fat based on height and weight. Your health care provider can help determine your BMI and help you achieve or maintain a healthy weight.  For females 68 years of age and older:   A BMI below 18.5 is considered underweight.  A BMI of 18.5 to 24.9 is normal.  A BMI of 25 to 29.9 is considered overweight.  A BMI of 30 and above is considered obese.  Watch levels of cholesterol and blood lipids  You should start having your blood tested for lipids and cholesterol at 63 years of age, then have this test every 5 years.  You may need to have your cholesterol levels checked more often if:  Your lipid or  cholesterol levels are high.  You are older than 63 years of age.  You are at high risk for heart disease.  CANCER SCREENING   Lung Cancer  Lung cancer screening is recommended for adults 58-76 years old who are at high risk for lung cancer because of a history of smoking.  A yearly low-dose CT scan of the lungs is recommended for people who:  Currently smoke.  Have quit within the past 15 years.  Have at least a 30-pack-year history of smoking. A pack year is smoking an average of one pack of cigarettes a day for 1 year.  Yearly screening should continue until it has been 15 years since you quit.  Yearly screening should stop if you develop a health problem that would prevent you from having lung cancer treatment.  Breast Cancer  Practice breast self-awareness. This means understanding how your breasts normally appear and feel.  It also means doing regular breast self-exams. Let your health care provider know about any changes, no matter how small.  If you are in your 20s or 30s, you should have a clinical breast exam (CBE) by a health care provider every 1-3 years as part of a regular health exam.  If you are 52 or older, have a CBE every year. Also consider having a breast X-ray (mammogram) every year.  If you have a family history of breast cancer, talk to your health care provider about genetic screening.  If you are  at high risk for breast cancer, talk to your health care provider about having an MRI and a mammogram every year.  Breast cancer gene (BRCA) assessment is recommended for women who have family members with BRCA-related cancers. BRCA-related cancers include:  Breast.  Ovarian.  Tubal.  Peritoneal cancers.  Results of the assessment will determine the need for genetic counseling and BRCA1 and BRCA2 testing. Cervical Cancer Routine pelvic examinations to screen for cervical cancer are no longer recommended for nonpregnant women who are considered low  risk for cancer of the pelvic organs (ovaries, uterus, and vagina) and who do not have symptoms. A pelvic examination may be necessary if you have symptoms including those associated with pelvic infections. Ask your health care provider if a screening pelvic exam is right for you.   The Pap test is the screening test for cervical cancer for women who are considered at risk.  If you had a hysterectomy for a problem that was not cancer or a condition that could lead to cancer, then you no longer need Pap tests.  If you are older than 65 years, and you have had normal Pap tests for the past 10 years, you no longer need to have Pap tests.  If you have had past treatment for cervical cancer or a condition that could lead to cancer, you need Pap tests and screening for cancer for at least 20 years after your treatment.  If you no longer get a Pap test, assess your risk factors if they change (such as having a new sexual partner). This can affect whether you should start being screened again.  Some women have medical problems that increase their chance of getting cervical cancer. If this is the case for you, your health care provider may recommend more frequent screening and Pap tests.  The human papillomavirus (HPV) test is another test that may be used for cervical cancer screening. The HPV test looks for the virus that can cause cell changes in the cervix. The cells collected during the Pap test can be tested for HPV.  The HPV test can be used to screen women 30 years of age and older. Getting tested for HPV can extend the interval between normal Pap tests from three to five years.  An HPV test also should be used to screen women of any age who have unclear Pap test results.  After 63 years of age, women should have HPV testing as often as Pap tests.  Colorectal Cancer  This type of cancer can be detected and often prevented.  Routine colorectal cancer screening usually begins at 63 years of  age and continues through 63 years of age.  Your health care provider may recommend screening at an earlier age if you have risk factors for colon cancer.  Your health care provider may also recommend using home test kits to check for hidden blood in the stool.  A small camera at the end of a tube can be used to examine your colon directly (sigmoidoscopy or colonoscopy). This is done to check for the earliest forms of colorectal cancer.  Routine screening usually begins at age 50.  Direct examination of the colon should be repeated every 5-10 years through 63 years of age. However, you may need to be screened more often if early forms of precancerous polyps or small growths are found. Skin Cancer  Check your skin from head to toe regularly.  Tell your health care provider about any new moles or changes in   moles, especially if there is a change in a mole's shape or color.  Also tell your health care provider if you have a mole that is larger than the size of a pencil eraser.  Always use sunscreen. Apply sunscreen liberally and repeatedly throughout the day.  Protect yourself by wearing long sleeves, pants, a wide-brimmed hat, and sunglasses whenever you are outside. HEART DISEASE, DIABETES, AND HIGH BLOOD PRESSURE   Have your blood pressure checked at least every 1-2 years. High blood pressure causes heart disease and increases the risk of stroke.  If you are between 75 years and 42 years old, ask your health care provider if you should take aspirin to prevent strokes.  Have regular diabetes screenings. This involves taking a blood sample to check your fasting blood sugar level.  If you are at a normal weight and have a low risk for diabetes, have this test once every three years after 63 years of age.  If you are overweight and have a high risk for diabetes, consider being tested at a younger age or more often. PREVENTING INFECTION  Hepatitis B  If you have a higher risk for  hepatitis B, you should be screened for this virus. You are considered at high risk for hepatitis B if:  You were born in a country where hepatitis B is common. Ask your health care provider which countries are considered high risk.  Your parents were born in a high-risk country, and you have not been immunized against hepatitis B (hepatitis B vaccine).  You have HIV or AIDS.  You use needles to inject street drugs.  You live with someone who has hepatitis B.  You have had sex with someone who has hepatitis B.  You get hemodialysis treatment.  You take certain medicines for conditions, including cancer, organ transplantation, and autoimmune conditions. Hepatitis C  Blood testing is recommended for:  Everyone born from [birth year] through 1964.  Anyone with known risk factors for hepatitis C. Sexually transmitted infections (STIs)  You should be screened for sexually transmitted infections (STIs) including gonorrhea and chlamydia if:  You are sexually active and are younger than 63 years of age.  You are older than 63 years of age and your health care provider tells you that you are at risk for this type of infection.  Your sexual activity has changed since you were last screened and you are at an increased risk for chlamydia or gonorrhea. Ask your health care provider if you are at risk.  If you do not have HIV, but are at risk, it may be recommended that you take a prescription medicine daily to prevent HIV infection. This is called pre-exposure prophylaxis (PrEP). You are considered at risk if:  You are sexually active and do not regularly use condoms or know the HIV status of your partner(s).  You take drugs by injection.  You are sexually active with a partner who has HIV. Talk with your health care provider about whether you are at high risk of being infected with HIV. If you choose to begin PrEP, you should first be tested for HIV. You should then be tested every 3 months for  as long as you are taking PrEP.  PREGNANCY   If you are premenopausal and you may become pregnant, ask your health care provider about preconception counseling.  If you may become pregnant, take 400 to 800 micrograms (mcg) of folic acid every day.  If you want to prevent pregnancy, talk to your  health care provider about birth control (contraception). OSTEOPOROSIS AND MENOPAUSE   Osteoporosis is a disease in which the bones lose minerals and strength with aging. This can result in serious bone fractures. Your risk for osteoporosis can be identified using a bone density scan.  If you are 42 years of age or older, or if you are at risk for osteoporosis and fractures, ask your health care provider if you should be screened.  Ask your health care provider whether you should take a calcium or vitamin D supplement to lower your risk for osteoporosis.  Menopause may have certain physical symptoms and risks.  Hormone replacement therapy may reduce some of these symptoms and risks. Talk to your health care provider about whether hormone replacement therapy is right for you.  HOME CARE INSTRUCTIONS   Schedule regular health, dental, and eye exams.  Stay current with your immunizations.   Do not use any tobacco products including cigarettes, chewing tobacco, or electronic cigarettes.  If you are pregnant, do not drink alcohol.  If you are breastfeeding, limit how much and how often you drink alcohol.  Limit alcohol intake to no more than 1 drink per day for nonpregnant women. One drink equals 12 ounces of beer, 5 ounces of wine, or 1 ounces of hard liquor.  Do not use street drugs.  Do not share needles.  Ask your health care provider for help if you need support or information about quitting drugs.  Tell your health care provider if you often feel depressed.  Tell your health care provider if you have ever been abused or do not feel safe at home. Document Released: 11/08/2010  Document Revised: 09/09/2013 Document Reviewed: 03/27/2013 [facility] Patient Information 2015 [place], [place]. This information is not intended to replace advice given to you by your health care provider. Make sure you discuss any questions you have with your health care provider. Health Maintenance Adopting a healthy lifestyle and getting preventive care can go a long way to promote health and wellness. Talk with your health care provider about what schedule of regular examinations is right for you. This is a good chance for you to check in with your provider about disease prevention and staying healthy. In between checkups, there are plenty of things you can do on your own. Experts have done a lot of research about which lifestyle changes and preventive measures are most likely to keep you healthy. Ask your health care provider for more information. WEIGHT AND DIET  Eat a healthy diet 4. Be sure to include plenty of vegetables, fruits, low-fat dairy products, and lean protein. 5. Do not eat a lot of foods high in solid fats, added sugars, or salt. 6. Get regular exercise. This is one of the most important things you can do for your health. 1. Most adults should exercise for at least 150 minutes each week. The exercise should increase your heart rate and make you sweat (moderate-intensity exercise). 2. Most adults should also do strengthening exercises at least twice a week. This is in addition to the moderate-intensity exercise.  Maintain a healthy weight  Body mass index (BMI) is a measurement that can be used to identify possible weight problems. It estimates body fat based on height and weight. Your health care provider can help determine your BMI and help you achieve or maintain a healthy weight.  For females 63 years of age and older:   A BMI below 18.5 is considered underweight.  A BMI of 18.5 to 24.9  is normal.  A BMI of 25 to 29.9 is considered overweight.  A BMI of 30 and above is  considered obese.  Watch levels of cholesterol and blood lipids  You should start having your blood tested for lipids and cholesterol at 63 years of age, then have this test every 5 years.  You may need to have your cholesterol levels checked more often if:  Your lipid or cholesterol levels are high.  You are older than 63 years of age.  You are at high risk for heart disease.  CANCER SCREENING   Lung Cancer  Lung cancer screening is recommended for adults 25-32 years old who are at high risk for lung cancer because of a history of smoking.  A yearly low-dose CT scan of the lungs is recommended for people who:  Currently smoke.  Have quit within the past 15 years.  Have at least a 30-pack-year history of smoking. A pack year is smoking an average of one pack of cigarettes a day for 1 year.  Yearly screening should continue until it has been 15 years since you quit.  Yearly screening should stop if you develop a health problem that would prevent you from having lung cancer treatment.  Breast Cancer  Practice breast self-awareness. This means understanding how your breasts normally appear and feel.  It also means doing regular breast self-exams. Let your health care provider know about any changes, no matter how small.  If you are in your 20s or 30s, you should have a clinical breast exam (CBE) by a health care provider every 1-3 years as part of a regular health exam.  If you are 101 or older, have a CBE every year. Also consider having a breast X-ray (mammogram) every year.  If you have a family history of breast cancer, talk to your health care provider about genetic screening.  If you are at high risk for breast cancer, talk to your health care provider about having an MRI and a mammogram every year.  Breast cancer gene (BRCA) assessment is recommended for women who have family members with BRCA-related cancers. BRCA-related cancers  include:  Breast.  Ovarian.  Tubal.  Peritoneal cancers.  Results of the assessment will determine the need for genetic counseling and BRCA1 and BRCA2 testing. Cervical Cancer Routine pelvic examinations to screen for cervical cancer are no longer recommended for nonpregnant women who are considered low risk for cancer of the pelvic organs (ovaries, uterus, and vagina) and who do not have symptoms. A pelvic examination may be necessary if you have symptoms including those associated with pelvic infections. Ask your health care provider if a screening pelvic exam is right for you.   The Pap test is the screening test for cervical cancer for women who are considered at risk.  If you had a hysterectomy for a problem that was not cancer or a condition that could lead to cancer, then you no longer need Pap tests.  If you are older than 65 years, and you have had normal Pap tests for the past 10 years, you no longer need to have Pap tests.  If you have had past treatment for cervical cancer or a condition that could lead to cancer, you need Pap tests and screening for cancer for at least 20 years after your treatment.  If you no longer get a Pap test, assess your risk factors if they change (such as having a new sexual partner). This can affect whether you should start  being screened again.  Some women have medical problems that increase their chance of getting cervical cancer. If this is the case for you, your health care provider may recommend more frequent screening and Pap tests.  The human papillomavirus (HPV) test is another test that may be used for cervical cancer screening. The HPV test looks for the virus that can cause cell changes in the cervix. The cells collected during the Pap test can be tested for HPV.  The HPV test can be used to screen women 32 years of age and older. Getting tested for HPV can extend the interval between normal Pap tests from three to five years.  An HPV  test also should be used to screen women of any age who have unclear Pap test results.  After 63 years of age, women should have HPV testing as often as Pap tests.  Colorectal Cancer  This type of cancer can be detected and often prevented.  Routine colorectal cancer screening usually begins at 63 years of age and continues through 63 years of age.  Your health care provider may recommend screening at an earlier age if you have risk factors for colon cancer.  Your health care provider may also recommend using home test kits to check for hidden blood in the stool.  A small camera at the end of a tube can be used to examine your colon directly (sigmoidoscopy or colonoscopy). This is done to check for the earliest forms of colorectal cancer.  Routine screening usually begins at age 6.  Direct examination of the colon should be repeated every 5-10 years through 63 years of age. However, you may need to be screened more often if early forms of precancerous polyps or small growths are found. Skin Cancer  Check your skin from head to toe regularly.  Tell your health care provider about any new moles or changes in moles, especially if there is a change in a mole's shape or color.  Also tell your health care provider if you have a mole that is larger than the size of a pencil eraser.  Always use sunscreen. Apply sunscreen liberally and repeatedly throughout the day.  Protect yourself by wearing long sleeves, pants, a wide-brimmed hat, and sunglasses whenever you are outside. HEART DISEASE, DIABETES, AND HIGH BLOOD PRESSURE   Have your blood pressure checked at least every 1-2 years. High blood pressure causes heart disease and increases the risk of stroke.  If you are between 68 years and 81 years old, ask your health care provider if you should take aspirin to prevent strokes.  Have regular diabetes screenings. This involves taking a blood sample to check your fasting blood sugar  level.  If you are at a normal weight and have a low risk for diabetes, have this test once every three years after 63 years of age.  If you are overweight and have a high risk for diabetes, consider being tested at a younger age or more often. PREVENTING INFECTION  Hepatitis B  If you have a higher risk for hepatitis B, you should be screened for this virus. You are considered at high risk for hepatitis B if:  You were born in a country where hepatitis B is common. Ask your health care provider which countries are considered high risk.  Your parents were born in a high-risk country, and you have not been immunized against hepatitis B (hepatitis B vaccine).  You have HIV or AIDS.  You use needles to inject  street drugs.  You live with someone who has hepatitis B.  You have had sex with someone who has hepatitis B.  You get hemodialysis treatment.  You take certain medicines for conditions, including cancer, organ transplantation, and autoimmune conditions. Hepatitis C  Blood testing is recommended for:  Everyone born from 1945 through 1965.  Anyone with known risk factors for hepatitis C. Sexually transmitted infections (STIs)  You should be screened for sexually transmitted infections (STIs) including gonorrhea and chlamydia if:  You are sexually active and are younger than 63 years of age.  You are older than 63 years of age and your health care provider tells you that you are at risk for this type of infection.  Your sexual activity has changed since you were last screened and you are at an increased risk for chlamydia or gonorrhea. Ask your health care provider if you are at risk.  If you do not have HIV, but are at risk, it may be recommended that you take a prescription medicine daily to prevent HIV infection. This is called pre-exposure prophylaxis (PrEP). You are considered at risk if:  You are sexually active and do not regularly use condoms or know the HIV status  of your partner(s).  You take drugs by injection.  You are sexually active with a partner who has HIV. Talk with your health care provider about whether you are at high risk of being infected with HIV. If you choose to begin PrEP, you should first be tested for HIV. You should then be tested every 3 months for as long as you are taking PrEP.  PREGNANCY   If you are premenopausal and you may become pregnant, ask your health care provider about preconception counseling.  If you may become pregnant, take 400 to 800 micrograms (mcg) of folic acid every day.  If you want to prevent pregnancy, talk to your health care provider about birth control (contraception). OSTEOPOROSIS AND MENOPAUSE   Osteoporosis is a disease in which the bones lose minerals and strength with aging. This can result in serious bone fractures. Your risk for osteoporosis can be identified using a bone density scan.  If you are 65 years of age or older, or if you are at risk for osteoporosis and fractures, ask your health care provider if you should be screened.  Ask your health care provider whether you should take a calcium or vitamin D supplement to lower your risk for osteoporosis.  Menopause may have certain physical symptoms and risks.  Hormone replacement therapy may reduce some of these symptoms and risks. Talk to your health care provider about whether hormone replacement therapy is right for you.  HOME CARE INSTRUCTIONS   Schedule regular health, dental, and eye exams.  Stay current with your immunizations.   Do not use any tobacco products including cigarettes, chewing tobacco, or electronic cigarettes.  If you are pregnant, do not drink alcohol.  If you are breastfeeding, limit how much and how often you drink alcohol.  Limit alcohol intake to no more than 1 drink per day for nonpregnant women. One drink equals 12 ounces of beer, 5 ounces of wine, or 1 ounces of hard liquor.  Do not use street  drugs.  Do not share needles.  Ask your health care provider for help if you need support or information about quitting drugs.  Tell your health care provider if you often feel depressed.  Tell your health care provider if you have ever been abused or   do not feel safe at home. Document Released: 11/08/2010 Document Revised: 09/09/2013 Document Reviewed: 03/27/2013 [facility] Patient Information 2015 [place], [place]. This information is not intended to replace advice given to you by your health care provider. Make sure you discuss any questions you have with your health care provider. Depression Depression refers to feeling sad, low, down in the dumps, blue, gloomy, or empty. In general, there are two kinds of depression: 7. Normal sadness or normal grief. This kind of depression is one that we all feel from time to time after upsetting life experiences, such as the loss of a job or the ending of a relationship. This kind of depression is considered normal, is short lived, and resolves within a few days to 2 weeks. Depression experienced after the loss of a loved one (bereavement) often lasts longer than 2 weeks but normally gets better with time. 8. Clinical depression. This kind of depression lasts longer than normal sadness or normal grief or interferes with your ability to function at home, at work, and in school. It also interferes with your personal relationships. It affects almost every aspect of your life. Clinical depression is an illness. Symptoms of depression can also be caused by conditions other than those mentioned above, such as:  Physical illness. Some physical illnesses, including underactive thyroid gland (hypothyroidism), severe anemia, specific types of cancer, diabetes, uncontrolled seizures, heart and lung problems, strokes, and chronic pain are commonly associated with symptoms of depression.  Side effects of some prescription medicine. In some people, certain types of  medicine can cause symptoms of depression.  Substance abuse. Abuse of alcohol and illicit drugs can cause symptoms of depression. SYMPTOMS Symptoms of normal sadness and normal grief include the following:  Feeling sad or crying for short periods of time.  Not caring about anything (apathy).  Difficulty sleeping or sleeping too much.  No longer able to enjoy the things you used to enjoy.  Desire to be by oneself all the time (social isolation).  Lack of energy or motivation.  Difficulty concentrating or remembering.  Change in appetite or weight.  Restlessness or agitation. Symptoms of clinical depression include the same symptoms of normal sadness or normal grief and also the following symptoms:  Feeling sad or crying all the time.  Feelings of guilt or worthlessness.  Feelings of hopelessness or helplessness.  Thoughts of suicide or the desire to harm yourself (suicidal ideation).  Loss of touch with reality (psychotic symptoms). Seeing or hearing things that are not real (hallucinations) or having false beliefs about your life or the people around you (delusions and paranoia). DIAGNOSIS  The diagnosis of clinical depression is usually based on how bad the symptoms are and how long they have lasted. Your health care provider will also ask you questions about your medical history and substance use to find out if physical illness, use of prescription medicine, or substance abuse is causing your depression. Your health care provider may also order blood tests. TREATMENT  Often, normal sadness and normal grief do not require treatment. However, sometimes antidepressant medicine is given for bereavement to ease the depressive symptoms until they resolve. The treatment for clinical depression depends on how bad the symptoms are but often includes antidepressant medicine, counseling with a mental health professional, or both. Your health care provider will help to determine what  treatment is best for you. Depression caused by physical illness usually goes away with appropriate medical treatment of the illness. If prescription medicine is causing depression, talk  with your health care provider about stopping the medicine, decreasing the dose, or changing to another medicine. Depression caused by the abuse of alcohol or illicit drugs goes away when you stop using these substances. Some adults need professional help in order to stop drinking or using drugs. SEEK IMMEDIATE MEDICAL CARE IF:  You have thoughts about hurting yourself or others.  You lose touch with reality (have psychotic symptoms).  You are taking medicine for depression and have a serious side effect. FOR MORE INFORMATION  National Alliance on Mental Illness: www.nami.[employer]: https://carter.com/ Document Released: 04/22/2000 Document Revised: 09/09/2013 Document Reviewed: 07/25/2011 [facility] Patient Information 2015 [place], [place]. This information is not intended to replace advice given to you by your health care provider. Make sure you discuss any questions you have with your health care provider.   Limit your sodium (Salt) intake    It is important that you exercise regularly, at least 20 minutes 3 to 4 times per week.  If you develop chest pain or shortness of breath seek  medical attention.  Schedule your colonoscopy to help detect colon cancer.

## 2014-10-01 ENCOUNTER — Encounter: Payer: Self-pay | Admitting: Internal Medicine

## 2014-12-09 ENCOUNTER — Ambulatory Visit (AMBULATORY_SURGERY_CENTER): Payer: Self-pay | Admitting: *Deleted

## 2014-12-09 VITALS — Ht 66.0 in | Wt 197.0 lb

## 2014-12-09 DIAGNOSIS — Z1211 Encounter for screening for malignant neoplasm of colon: Secondary | ICD-10-CM

## 2014-12-09 NOTE — Progress Notes (Signed)
No egg or soy allergy.  No oxygen at home.  No diet medication.  No problems with anesthesia.

## 2014-12-23 ENCOUNTER — Encounter: Payer: Self-pay | Admitting: Internal Medicine

## 2014-12-23 ENCOUNTER — Ambulatory Visit (AMBULATORY_SURGERY_CENTER): Payer: BLUE CROSS/BLUE SHIELD | Admitting: Internal Medicine

## 2014-12-23 VITALS — BP 150/91 | HR 63 | Temp 96.3°F | Resp 43 | Ht 66.0 in | Wt 197.0 lb

## 2014-12-23 DIAGNOSIS — Z1211 Encounter for screening for malignant neoplasm of colon: Secondary | ICD-10-CM

## 2014-12-23 MED ORDER — SODIUM CHLORIDE 0.9 % IV SOLN: 500.0000 mL | INTRAVENOUS | Status: DC

## 2014-12-23 NOTE — Progress Notes (Signed)
Transferred to recovery room. A/O x3, pleased with MAC.  VSS.  Report to Jane, RN. 

## 2014-12-23 NOTE — Op Note (Signed)
[place]  [address] [place], [id number]   COLONOSCOPY PROCEDURE REPORT  PATIENT: Danielle Morse, Danielle Morse  MR#: [id number] BIRTHDATE: 17-Aug-1950 , 67  yrs. old GENDER: female ENDOSCOPIST: Gatha Mayer, MD, [facility] PROCEDURE DATE:  12/23/2014 PROCEDURE:   Colonoscopy, screening First Screening Colonoscopy - Avg.  risk and is 50 yrs.  old or older - No.  Prior Negative Screening - Now for repeat screening. 10 or more years since last screening  History of Adenoma - Now for follow-up colonoscopy & has been > or = to 3 yrs.  N/A  Polyps removed today? No Recommend repeat exam, <10 yrs? No ASA CLASS:   Class I INDICATIONS:Screening for colonic neoplasia and Colorectal Neoplasm Risk Assessment for this procedure is average risk. MEDICATIONS: Propofol 300 mg IV and Monitored anesthesia care  DESCRIPTION OF PROCEDURE:   After the risks benefits and alternatives of the procedure were thoroughly explained, informed consent was obtained.  The digital rectal exam revealed no abnormalities of the rectum.   The LB [id number]  endoscope was introduced through the anus and advanced to the cecum, which was identified by both the appendix and ileocecal valve. No adverse events experienced.   The quality of the prep was excellent. (MiraLax was used)  The instrument was then slowly withdrawn as the colon was fully examined. Estimated blood loss is zero unless otherwise noted in this procedure report.      COLON FINDINGS: A normal appearing cecum, ileocecal valve, and appendiceal orifice were identified.  The ascending, transverse, descending, sigmoid colon, and rectum appeared unremarkable. Retroflexed views revealed no abnormalities. The time to cecum = 4.4 Withdrawal time = 10.7   The scope was withdrawn and the procedure completed. COMPLICATIONS: There were no immediate complications.  ENDOSCOPIC IMPRESSION: Normal colonoscopy - excellent prep (second  screening)  RECOMMENDATIONS: Repeat colon screening 10 years. 2026  eSigned:  Gatha Mayer, MD, [facility] 12/23/2014 9:49 AM   cc: Dr. Simonne Martinet and The Patient

## 2014-12-23 NOTE — Patient Instructions (Addendum)
No polyps!  Next routine screening test in 10 years - 2026  I appreciate the opportunity to care for you. Gatha Mayer, MD, FACGYOU HAD AN ENDOSCOPIC PROCEDURE TODAY AT [facility]:   Refer to the procedure report that was given to you for any specific questions about what was found during the examination.  If the procedure report does not answer your questions, please call your gastroenterologist to clarify.  If you requested that your care partner not be given the details of your procedure findings, then the procedure report has been included in a sealed envelope for you to review at your convenience later.  YOU SHOULD EXPECT: Some feelings of bloating in the abdomen. Passage of more gas than usual.  Walking can help get rid of the air that was put into your GI tract during the procedure and reduce the bloating. If you had a lower endoscopy (such as a colonoscopy or flexible sigmoidoscopy) you may notice spotting of blood in your stool or on the toilet paper. If you underwent a bowel prep for your procedure, you may not have a normal bowel movement for a few days.  Please Note:  You might notice some irritation and congestion in your nose or some drainage.  This is from the oxygen used during your procedure.  There is no need for concern and it should clear up in a day or so.  SYMPTOMS TO REPORT IMMEDIATELY:   Following lower endoscopy (colonoscopy or flexible sigmoidoscopy):  Excessive amounts of blood in the stool  Significant tenderness or worsening of abdominal pains  Swelling of the abdomen that is new, acute  Fever of 100F or higher    For urgent or emergent issues, a gastroenterologist can be reached at any hour by calling [phone].   DIET: Your first meal following the procedure should be a small meal and then it is ok to progress to your normal diet. Heavy or fried foods are harder to digest and may make you feel nauseous or bloated.  Likewise,  meals heavy in dairy and vegetables can increase bloating.  Drink plenty of fluids but you should avoid alcoholic beverages for 24 hours.  ACTIVITY:  You should plan to take it easy for the rest of today and you should NOT DRIVE or use heavy machinery until tomorrow (because of the sedation medicines used during the test).    FOLLOW UP: Our staff will call the number listed on your records the next business day following your procedure to check on you and address any questions or concerns that you may have regarding the information given to you following your procedure. If we do not reach you, we will leave a message.  However, if you are feeling well and you are not experiencing any problems, there is no need to return our call.  We will assume that you have returned to your regular daily activities without incident.  If any biopsies were taken you will be contacted by phone or by letter within the next 1-3 weeks.  Please call us at [phone] if you have not heard about the biopsies in 3 weeks.    SIGNATURES/CONFIDENTIALITY: You and/or your care partner have signed paperwork which will be entered into your electronic medical record.  These signatures attest to the fact that that the information above on your After Visit Summary has been reviewed and is understood.  Full responsibility of the confidentiality of this discharge information lies with you and/or  your care-partner.  Next colonoscopy 10 years-2026.

## 2014-12-24 ENCOUNTER — Telehealth: Payer: Self-pay | Admitting: *Deleted

## 2014-12-24 NOTE — Telephone Encounter (Signed)
  Follow up Call-  Call back number 12/23/2014  Post procedure Call Back phone  # [phone]  Permission to leave phone message Yes     Patient questions:  Do you have a fever, pain , or abdominal swelling? No. Pain Score  0 *  Have you tolerated food without any problems? Yes.    Have you been able to return to your normal activities? Yes.    Do you have any questions about your discharge instructions: Diet   No. Medications  No. Follow up visit  No.  Do you have questions or concerns about your Care? No.  Actions: * If pain score is 4 or above: No action needed, pain <4.

## 2015-03-20 ENCOUNTER — Other Ambulatory Visit: Payer: Self-pay | Admitting: Internal Medicine

## 2015-05-26 ENCOUNTER — Ambulatory Visit (INDEPENDENT_AMBULATORY_CARE_PROVIDER_SITE_OTHER): Payer: BLUE CROSS/BLUE SHIELD | Admitting: *Deleted

## 2015-05-26 DIAGNOSIS — Z23 Encounter for immunization: Secondary | ICD-10-CM | POA: Diagnosis not present

## 2015-07-13 ENCOUNTER — Other Ambulatory Visit: Payer: Self-pay

## 2015-07-13 DIAGNOSIS — Z1231 Encounter for screening mammogram for malignant neoplasm of breast: Secondary | ICD-10-CM

## 2015-08-19 ENCOUNTER — Ambulatory Visit: Payer: BLUE CROSS/BLUE SHIELD

## 2015-08-26 ENCOUNTER — Ambulatory Visit
Admission: RE | Admit: 2015-08-26 | Discharge: 2015-08-26 | Disposition: A | Discharge status: Home / Self Care | Payer: BLUE CROSS/BLUE SHIELD | Source: Ambulatory Visit

## 2015-08-26 DIAGNOSIS — Z1231 Encounter for screening mammogram for malignant neoplasm of breast: Secondary | ICD-10-CM

## 2015-09-16 ENCOUNTER — Other Ambulatory Visit: Payer: Self-pay | Admitting: Internal Medicine

## 2015-09-18 ENCOUNTER — Other Ambulatory Visit (INDEPENDENT_AMBULATORY_CARE_PROVIDER_SITE_OTHER): Payer: BLUE CROSS/BLUE SHIELD

## 2015-09-18 DIAGNOSIS — Z0000 Encounter for general adult medical examination without abnormal findings: Secondary | ICD-10-CM | POA: Diagnosis not present

## 2015-09-18 LAB — POC URINALSYSI DIPSTICK (AUTOMATED)
BILIRUBIN UA: NEGATIVE
GLUCOSE UA: NEGATIVE
KETONES UA: NEGATIVE
Leukocytes, UA: NEGATIVE
Nitrite, UA: NEGATIVE
Protein, UA: NEGATIVE
Spec Grav, UA: 1.01
Urobilinogen, UA: 0.2
pH, UA: 7

## 2015-09-18 LAB — CBC WITH DIFFERENTIAL/PLATELET
BASOS ABS: 0 10*3/uL (ref 0.0–0.1)
Basophils Relative: 0.6 % (ref 0.0–3.0)
EOS PCT: 2.8 % (ref 0.0–5.0)
Eosinophils Absolute: 0.1 10*3/uL (ref 0.0–0.7)
HEMATOCRIT: 37.9 % (ref 36.0–46.0)
HEMOGLOBIN: 12.6 g/dL (ref 12.0–15.0)
LYMPHS PCT: 48.2 % — AB (ref 12.0–46.0)
Lymphs Abs: 2.5 10*3/uL (ref 0.7–4.0)
MCHC: 33.4 g/dL (ref 30.0–36.0)
MCV: 90.1 fl (ref 78.0–100.0)
MONOS PCT: 7.6 % (ref 3.0–12.0)
Monocytes Absolute: 0.4 10*3/uL (ref 0.1–1.0)
NEUTROS PCT: 40.8 % — AB (ref 43.0–77.0)
Neutro Abs: 2.1 10*3/uL (ref 1.4–7.7)
Platelets: 298 10*3/uL (ref 150.0–400.0)
RBC: 4.2 Mil/uL (ref 3.87–5.11)
RDW: 14.9 % (ref 11.5–15.5)
WBC: 5.2 10*3/uL (ref 4.0–10.5)

## 2015-09-18 LAB — LIPID PANEL
CHOL/HDL RATIO: 7
Cholesterol: 230 mg/dL — ABNORMAL HIGH (ref 0–200)
HDL: 32.6 mg/dL — ABNORMAL LOW (ref >39.00)
LDL CALC: 173 mg/dL — AB (ref 0–99)
NONHDL: 197.67
TRIGLYCERIDES: 122 mg/dL (ref 0.0–149.0)
VLDL: 24.4 mg/dL (ref 0.0–40.0)

## 2015-09-18 LAB — TSH: TSH: 0.77 u[IU]/mL (ref 0.35–4.50)

## 2015-09-18 LAB — BASIC METABOLIC PANEL
BUN: 13 mg/dL (ref 6–23)
CALCIUM: 9.6 mg/dL (ref 8.4–10.5)
CO2: 29 mEq/L (ref 19–32)
Chloride: 98 mEq/L (ref 96–112)
Creatinine, Ser: 0.72 mg/dL (ref 0.40–1.20)
GFR: 104.77 mL/min (ref >60.00)
Glucose, Bld: 114 mg/dL — ABNORMAL HIGH (ref 70–99)
POTASSIUM: 3.7 meq/L (ref 3.5–5.1)
SODIUM: 139 meq/L (ref 135–145)

## 2015-09-18 LAB — HEPATIC FUNCTION PANEL
ALBUMIN: 4.5 g/dL (ref 3.5–5.2)
ALK PHOS: 70 U/L (ref 39–117)
ALT: 15 U/L (ref 0–35)
AST: 12 U/L (ref 0–37)
Bilirubin, Direct: 0.1 mg/dL (ref 0.0–0.3)
TOTAL PROTEIN: 7.2 g/dL (ref 6.0–8.3)
Total Bilirubin: 0.5 mg/dL (ref 0.2–1.2)

## 2015-09-25 ENCOUNTER — Ambulatory Visit (INDEPENDENT_AMBULATORY_CARE_PROVIDER_SITE_OTHER): Payer: BLUE CROSS/BLUE SHIELD | Admitting: Internal Medicine

## 2015-09-25 ENCOUNTER — Encounter: Payer: Self-pay | Admitting: Internal Medicine

## 2015-09-25 VITALS — BP 122/66 | HR 69 | Temp 98.4°F | Ht 66.0 in | Wt 191.0 lb

## 2015-09-25 DIAGNOSIS — E785 Hyperlipidemia, unspecified: Secondary | ICD-10-CM | POA: Diagnosis not present

## 2015-09-25 DIAGNOSIS — Z0000 Encounter for general adult medical examination without abnormal findings: Secondary | ICD-10-CM

## 2015-09-25 DIAGNOSIS — I10 Essential (primary) hypertension: Secondary | ICD-10-CM | POA: Diagnosis not present

## 2015-09-25 NOTE — Patient Instructions (Signed)
Limit your sodium (Salt) intake  You need to lose weight.  Consider a lower calorie diet and regular exercise.    It is important that you exercise regularly, at least 20 minutes 3 to 4 times per week.  If you develop chest pain or shortness of breath seek  medical attention.  Please check your blood pressure on a regular basis.  If it is consistently greater than 150/90, please make an office appointment.  Return in one year for follow-up   Menopause is a normal process in which your reproductive ability comes to an end. This process happens gradually over a span of months to years, usually between the ages of 48 and 100. Menopause is complete when you have missed 12 consecutive menstrual periods. It is important to talk with your health care provider about some of the most common conditions that affect postmenopausal women, such as heart disease, cancer, and bone loss (osteoporosis). Adopting a healthy lifestyle and getting preventive care can help to promote your health and wellness. Those actions can also lower your chances of developing some of these common conditions. WHAT SHOULD I KNOW ABOUT MENOPAUSE? During menopause, you may experience a number of symptoms, such as:  Moderate-to-severe hot flashes.  Night sweats.  Decrease in sex drive.  Mood swings.  Headaches.  Tiredness.  Irritability.  Memory problems.  Insomnia. Choosing to treat or not to treat menopausal changes is an individual decision that you make with your health care provider. WHAT SHOULD I KNOW ABOUT HORMONE REPLACEMENT THERAPY AND SUPPLEMENTS? Hormone therapy products are effective for treating symptoms that are associated with menopause, such as hot flashes and night sweats. Hormone replacement carries certain risks, especially as you become older. If you are thinking about using estrogen or estrogen with progestin treatments, discuss the benefits and risks with your health care provider. WHAT SHOULD I  KNOW ABOUT HEART DISEASE AND STROKE? Heart disease, heart attack, and stroke become more likely as you age. This may be due, in part, to the hormonal changes that your body experiences during menopause. These can affect how your body processes dietary fats, triglycerides, and cholesterol. Heart attack and stroke are both medical emergencies. There are many things that you can do to help prevent heart disease and stroke:  Have your blood pressure checked at least every 1-2 years. High blood pressure causes heart disease and increases the risk of stroke.  If you are 24-62 years old, ask your health care provider if you should take aspirin to prevent a heart attack or a stroke.  Do not use any tobacco products, including cigarettes, chewing tobacco, or electronic cigarettes. If you need help quitting, ask your health care provider.  It is important to eat a healthy diet and maintain a healthy weight.  Be sure to include plenty of vegetables, fruits, low-fat dairy products, and lean protein.  Avoid eating foods that are high in solid fats, added sugars, or salt (sodium).  Get regular exercise. This is one of the most important things that you can do for your health.  Try to exercise for at least 150 minutes each week. The type of exercise that you do should increase your heart rate and make you sweat. This is known as moderate-intensity exercise.  Try to do strengthening exercises at least twice each week. Do these in addition to the moderate-intensity exercise.  Know your numbers.Ask your health care provider to check your cholesterol and your blood glucose. Continue to have your blood tested as directed  by your health care provider. WHAT SHOULD I KNOW ABOUT CANCER SCREENING? There are several types of cancer. Take the following steps to reduce your risk and to catch any cancer development as early as possible. Breast Cancer  Practice breast self-awareness.  This means understanding how  your breasts normally appear and feel.  It also means doing regular breast self-exams. Let your health care provider know about any changes, no matter how small.  If you are 3 or older, have a clinician do a breast exam (clinical breast exam or CBE) every year. Depending on your age, family history, and medical history, it may be recommended that you also have a yearly breast X-ray (mammogram).  If you have a family history of breast cancer, talk with your health care provider about genetic screening.  If you are at high risk for breast cancer, talk with your health care provider about having an MRI and a mammogram every year.  Breast cancer (BRCA) gene test is recommended for women who have family members with BRCA-related cancers. Results of the assessment will determine the need for genetic counseling and BRCA1 and for BRCA2 testing. BRCA-related cancers include these types:  Breast. This occurs in males or females.  Ovarian.  Tubal. This may also be called fallopian tube cancer.  Cancer of the abdominal or pelvic lining (peritoneal cancer).  Prostate.  Pancreatic. Cervical, Uterine, and Ovarian Cancer Your health care provider may recommend that you be screened regularly for cancer of the pelvic organs. These include your ovaries, uterus, and vagina. This screening involves a pelvic exam, which includes checking for microscopic changes to the surface of your cervix (Pap test).  For women ages 21-65, health care providers may recommend a pelvic exam and a Pap test every three years. For women ages 35-65, they may recommend the Pap test and pelvic exam, combined with testing for human papilloma virus (HPV), every five years. Some types of HPV increase your risk of cervical cancer. Testing for HPV may also be done on women of any age who have unclear Pap test results.  Other health care providers may not recommend any screening for nonpregnant women who are considered low risk for  pelvic cancer and have no symptoms. Ask your health care provider if a screening pelvic exam is right for you.  If you have had past treatment for cervical cancer or a condition that could lead to cancer, you need Pap tests and screening for cancer for at least 20 years after your treatment. If Pap tests have been discontinued for you, your risk factors (such as having a new sexual partner) need to be reassessed to determine if you should start having screenings again. Some women have medical problems that increase the chance of getting cervical cancer. In these cases, your health care provider may recommend that you have screening and Pap tests more often.  If you have a family history of uterine cancer or ovarian cancer, talk with your health care provider about genetic screening.  If you have vaginal bleeding after reaching menopause, tell your health care provider.  There are currently no reliable tests available to screen for ovarian cancer. Lung Cancer Lung cancer screening is recommended for adults 57-34 years old who are at high risk for lung cancer because of a history of smoking. A yearly low-dose CT scan of the lungs is recommended if you:  Currently smoke.  Have a history of at least 30 pack-years of smoking and you currently smoke or have quit  within the past 15 years. A pack-year is smoking an average of one pack of cigarettes per day for one year. Yearly screening should:  Continue until it has been 15 years since you quit.  Stop if you develop a health problem that would prevent you from having lung cancer treatment. Colorectal Cancer  This type of cancer can be detected and can often be prevented.  Routine colorectal cancer screening usually begins at age 42 and continues through age 11.  If you have risk factors for colon cancer, your health care provider may recommend that you be screened at an earlier age.  If you have a family history of colorectal cancer, talk with  your health care provider about genetic screening.  Your health care provider may also recommend using home test kits to check for hidden blood in your stool.  A small camera at the end of a tube can be used to examine your colon directly (sigmoidoscopy or colonoscopy). This is done to check for the earliest forms of colorectal cancer.  Direct examination of the colon should be repeated every 5-10 years until age 40. However, if early forms of precancerous polyps or small growths are found or if you have a family history or genetic risk for colorectal cancer, you may need to be screened more often. Skin Cancer  Check your skin from head to toe regularly.  Monitor any moles. Be sure to tell your health care provider:  About any new moles or changes in moles, especially if there is a change in a mole's shape or color.  If you have a mole that is larger than the size of a pencil eraser.  If any of your family members has a history of skin cancer, especially at a young age, talk with your health care provider about genetic screening.  Always use sunscreen. Apply sunscreen liberally and repeatedly throughout the day.  Whenever you are outside, protect yourself by wearing long sleeves, pants, a wide-brimmed hat, and sunglasses. WHAT SHOULD I KNOW ABOUT OSTEOPOROSIS? Osteoporosis is a condition in which bone destruction happens more quickly than new bone creation. After menopause, you may be at an increased risk for osteoporosis. To help prevent osteoporosis or the bone fractures that can happen because of osteoporosis, the following is recommended:  If you are 45-22 years old, get at least 1,000 mg of calcium and at least 600 mg of vitamin D per day.  If you are older than age 48 but younger than age 20, get at least 1,200 mg of calcium and at least 600 mg of vitamin D per day.  If you are older than age 27, get at least 1,200 mg of calcium and at least 800 mg of vitamin D per day. Smoking  and excessive alcohol intake increase the risk of osteoporosis. Eat foods that are rich in calcium and vitamin D, and do weight-bearing exercises several times each week as directed by your health care provider. WHAT SHOULD I KNOW ABOUT HOW MENOPAUSE AFFECTS Nina? Depression may occur at any age, but it is more common as you become older. Common symptoms of depression include:  Low or sad mood.  Changes in sleep patterns.  Changes in appetite or eating patterns.  Feeling an overall lack of motivation or enjoyment of activities that you previously enjoyed.  Frequent crying spells. Talk with your health care provider if you think that you are experiencing depression. WHAT SHOULD I KNOW ABOUT IMMUNIZATIONS? It is important that you get  and maintain your immunizations. These include:  Tetanus, diphtheria, and pertussis (Tdap) booster vaccine.  Influenza every year before the flu season begins.  Pneumonia vaccine.  Shingles vaccine. Your health care provider may also recommend other immunizations.   This information is not intended to replace advice given to you by your health care provider. Make sure you discuss any questions you have with your health care provider.   Document Released: 06/17/2005 Document Revised: 05/16/2014 Document Reviewed: 12/26/2013 Elsevier Interactive Patient Education [employer].

## 2015-09-25 NOTE — Progress Notes (Signed)
Subjective:    Patient ID: Danielle Morse, female    DOB: 1950/09/17, 65 y.o.   MRN: [id number]  HPI   Wt Readings from Last 3 Encounters:  09/25/15 191 lb (86.637 kg)  12/23/14 197 lb (89.359 kg)  12/09/14 197 lb (89.359 kg)    Subjective:    Patient ID: Danielle Morse, female    DOB: 04-06-1951, 65 y.o.   MRN: [id number]  HPI  History of Present Illness:   65    year-old patient who is seen today for a preventative health examination.   She has a history of mild dyslipidemia;  Total cholesterol has ranged from near-normal to the 250 range. She denies any cardiopulmonary complaints. She did have colonoscopy in 2006 in follow-up in 2016.  She receives annual mammograms.   She has a history of depression and had a relapse a couple of years ago when she retired.  She has decrease Lexapro to 5 mg daily and continues to do well. She has mild hypertension which has been well controlled on diuretic therapy   Wt Readings from Last 3 Encounters:  09/25/15 191 lb (86.637 kg)  12/23/14 197 lb (89.359 kg)  12/09/14 197 lb (89.359 kg)    BP Readings from Last 3 Encounters:  09/25/15 122/66  12/23/14 150/91  09/22/14 140/80    Preventive Screening-Counseling & Management  Alcohol-Tobacco  Smoking Status: never   Allergies (verified):  No Known Drug Allergies  Past History:   Past Medical History:   Hyperlipidemia   Past Surgical History:   Hysterectomy 1983  gravida two, para two, abortus zero- 1976, 1983  colonoscopy 2006  Family History:   father died in his mid 90s of coronary artery disease (stepfather) mother died in her mid 63s. Complications of senile dementia  Six sisters two brothers- two sisters died of complications of lung cancer   Social History:   Divorced  Never Smoked  Past Medical History  Diagnosis Date  . HYPERLIPIDEMIA 06/05/2008  . MENOPAUSAL SYNDROME 06/05/2008    History   Social History  . Marital Status: Single    Spouse Name:  N/A    Number of Children: N/A  . Years of Education: N/A   Occupational History  . Not on file.   Social History Main Topics  . Smoking status: Never Smoker   . Smokeless tobacco: Never Used  . Alcohol Use: No  . Drug Use: No  . Sexually Active: Not on file   Other Topics Concern  . Not on file   Social History Narrative  . No narrative on file    Past Surgical History  Procedure Date  . Abdominal hysterectomy   . Colonoscopy 2005    Family History  Problem Relation Age of Onset  . Heart disease Father   . Cancer Sister     No Known Allergies  No current outpatient prescriptions on file prior to visit.    BP 140/80  Pulse 74  Temp(Src) 98.2 F (36.8 C) (Oral)  Resp 18  Ht 5\' 7"  (1.702 m)  Wt 206 lb (93.441 kg)  BMI 32.26 kg/m2  SpO2 98%    Review of Systems  Constitutional: Negative.   HENT: Negative for hearing loss, congestion, sore throat, rhinorrhea, dental problem, sinus pressure and tinnitus.   Eyes: Negative for pain, discharge and visual disturbance.  Respiratory: Negative for cough and shortness of breath.   Cardiovascular: Negative for chest pain, palpitations and leg swelling.  Gastrointestinal: Negative for nausea, vomiting, abdominal pain,  diarrhea, constipation, blood in stool and abdominal distention.  Genitourinary: Negative for dysuria, urgency, frequency, hematuria, flank pain, vaginal bleeding, vaginal discharge, difficulty urinating, vaginal pain and pelvic pain.  Musculoskeletal: Negative for joint swelling, arthralgias and gait problem.  Skin: Negative for rash.  Neurological: Negative for dizziness, syncope, speech difficulty, weakness, numbness and headaches.  Hematological: Negative for adenopathy.  Psychiatric/Behavioral: Negative for behavioral problems, dysphoric mood and agitation. The patient is not nervous/anxious.        Objective:   Physical Exam  Constitutional: She is oriented to person, place, and time. She  appears well-developed and well-nourished.  HENT:  Head: Normocephalic and atraumatic.  Right Ear: External ear normal.  Left Ear: External ear normal.  Mouth/Throat: Oropharynx is clear and moist.  Eyes: Conjunctivae and EOM are normal.  Neck: Normal range of motion. Neck supple. No JVD present. No thyromegaly present.  Cardiovascular: Normal rate, regular rhythm, normal heart sounds and intact distal pulses.   No murmur heard. Pulmonary/Chest: Effort normal and breath sounds normal. She has no wheezes. She has no rales.  Abdominal: Soft. Bowel sounds are normal. She exhibits no distension and no mass. There is no tenderness. There is no rebound and no guarding.  Musculoskeletal: Normal range of motion. She exhibits no edema and no tenderness.  Neurological: She is alert and oriented to person, place, and time. She has normal reflexes. No cranial nerve deficit. She exhibits normal muscle tone. Coordination normal.  Skin: Skin is warm and dry. No rash noted.  Psychiatric: She has a normal mood and affect. Her behavior is normal.          Assessment & Plan:   Dyslipidemia.  weight loss encouraged Preventative health examination  Low salt diet exercise encouraged home blood pressure monitor and encouraged    Review of Systems  Psychiatric/Behavioral: Negative for dysphoric mood. The patient is not nervous/anxious.    See above     Objective:   Physical Exam  Constitutional:  Blood pressure 122 over 66 Weight 191    See above      Assessment & Plan:  Preventive health exam Clinical depression.  Much improved.  Will continue low-dose Lexapro Essential hypertension, well-controlled Dyslipidemia stable Mild obesity.  Improved.  Will continue efforts at weight loss and regular exercise program

## 2015-10-19 ENCOUNTER — Other Ambulatory Visit: Payer: Self-pay | Admitting: Internal Medicine

## 2015-10-19 NOTE — Telephone Encounter (Signed)
Refill sent in to [facility] 

## 2016-03-19 ENCOUNTER — Other Ambulatory Visit: Payer: Self-pay | Admitting: Internal Medicine

## 2016-04-05 ENCOUNTER — Ambulatory Visit (INDEPENDENT_AMBULATORY_CARE_PROVIDER_SITE_OTHER): Payer: BLUE CROSS/BLUE SHIELD

## 2016-04-05 DIAGNOSIS — Z23 Encounter for immunization: Secondary | ICD-10-CM

## 2016-05-24 NOTE — Progress Notes (Deleted)
  [facility] [address] [place], [place] [address] Phone: [phone] Subjective:    I'm seeing this patient by the request  of: Nyoka Cowden, MD    CC: Right hand pain  [id number]  Danielle Morse is a 66 y.o. female coming in with complaint of ***     Past Medical History:  Diagnosis Date  . Anxiety   . Depression   . DVT (deep venous thrombosis) ([place])    History over 30 yrs ago, no further problems  . HYPERLIPIDEMIA 06/05/2008  . MENOPAUSAL SYNDROME 06/05/2008   Past Surgical History:  Procedure Laterality Date  . ABDOMINAL HYSTERECTOMY    . CESAREAN SECTION     x 2  . COLONOSCOPY  2005  . CYSTECTOMY     left elbow  . LAPAROSCOPIC OVARIAN CYSTECTOMY     Social History   Social History  . Marital status: Single    Spouse name: N/A  . Number of children: N/A  . Years of education: N/A   Social History Main Topics  . Smoking status: Never Smoker  . Smokeless tobacco: Never Used  . Alcohol use No  . Drug use: No  . Sexual activity: Not on file   Other Topics Concern  . Not on file   Social History Narrative  . No narrative on file   No Known Allergies Family History  Problem Relation Age of Onset  . Heart disease Father   . Cancer Sister   . Colon cancer Neg Hx     Past medical history, social, surgical and family history all reviewed in electronic medical record.  No pertanent information unless stated regarding to the chief complaint.   Review of Systems:Review of systems updated and as accurate as of 05/24/16  No headache, visual changes, nausea, vomiting, diarrhea, constipation, dizziness, abdominal pain, skin rash, fevers, chills, night sweats, weight loss, swollen lymph nodes, body aches, joint swelling, muscle aches, chest pain, shortness of breath, mood changes.   Objective  There were no vitals taken for this visit. Systems examined below as of 05/24/16   General: No apparent distress alert and  oriented x3 mood and affect normal, dressed appropriately.  HEENT: Pupils equal, extraocular movements intact  Respiratory: Patient's speak in full sentences and does not appear short of breath  Cardiovascular: No lower extremity edema, non tender, no erythema  Skin: Warm dry intact with no signs of infection or rash on extremities or on axial skeleton.  Abdomen: Soft nontender  Neuro: Cranial nerves II through XII are intact, neurovascularly intact in all extremities with 2+ DTRs and 2+ pulses.  Lymph: No lymphadenopathy of posterior or anterior cervical chain or axillae bilaterally.  Gait normal with good balance and coordination.  MSK:  Non tender with full range of motion and good stability and symmetric strength and tone of shoulders, elbows, wrist, hip, knee and ankles bilaterally.  Hand exam shows  Limited musculoskeletal ultrasound was performed and interpreted by Lyndal Pulley  Limited ultrasound the patient's right hand shows Impression:    Impression and Recommendations:     This case required medical decision making of moderate complexity.      Note: This dictation was prepared with Dragon dictation along with smaller phrase technology. Any transcriptional errors that result from this process are unintentional.

## 2016-05-25 ENCOUNTER — Ambulatory Visit: Payer: BLUE CROSS/BLUE SHIELD | Admitting: Family Medicine

## 2016-06-09 NOTE — Progress Notes (Signed)
[name] [address], [place] [address] Phone: [phone] Subjective:    I'm seeing this patient by the request  of:  Nyoka Cowden, MD   CC: Right thumb pain  [id number]  Danielle Morse is a 66 y.o. female coming in with complaint of right thumb pain. 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 Patient states that the pain is worsening. In keeping her from doing certain activities. Can even hurt to open a door at this time. Sometimes a sharp pain with certain movements. Denies any numbness. Has not notice any swelling. Seems to be all localized around the thumb itself. Rates the severity of pain sometimes as 9 out of 10. Has not responded to over-the-counter medications.     Past Medical History:  Diagnosis Date  . Anxiety   . Depression   . DVT (deep venous thrombosis) ([place])    History over 30 yrs ago, no further problems  . HYPERLIPIDEMIA 06/05/2008  . MENOPAUSAL SYNDROME 06/05/2008   Past Surgical History:  Procedure Laterality Date  . ABDOMINAL HYSTERECTOMY    . CESAREAN SECTION     x 2  . COLONOSCOPY  2005  . CYSTECTOMY     left elbow  . LAPAROSCOPIC OVARIAN CYSTECTOMY     Social History   Social History  . Marital status: Single    Spouse name: N/A  . Number of children: N/A  . Years of education: N/A   Social History Main Topics  . Smoking status: Never Smoker  . Smokeless tobacco: Never Used  . Alcohol use No  . Drug use: No  . Sexual activity: Not on file   Other Topics Concern  . Not on file   Social History Narrative  . No narrative on file   No Known Allergies Family History  Problem Relation Age of Onset  . Heart disease Father   . Cancer Sister   . Colon cancer Neg Hx     Past medical history, social, surgical and family history all reviewed in electronic medical record.  No pertanent information unless stated regarding to the chief complaint.    Review of Systems:Review of systems updated and as accurate as of 06/09/16  No headache, visual changes, nausea, vomiting, diarrhea, constipation, dizziness, abdominal pain, skin rash, fevers, chills, night sweats, weight loss, swollen lymph nodes, body aches, joint swelling, muscle aches, chest pain, shortness of breath, mood changes.   Objective  There were no vitals taken for this visit. Systems examined below as of 06/09/16   General: No apparent distress alert and oriented x3 mood and affect normal, dressed appropriately.  HEENT: Pupils equal, extraocular movements intact  Respiratory: Patient's speak in full sentences and does not appear short of breath  Cardiovascular: No lower extremity edema, non tender, no erythema  Skin: Warm dry intact with no signs of infection or rash on extremities or on axial skeleton.  Abdomen: Soft nontender  Neuro: Cranial nerves II through XII are intact, neurovascularly intact in all extremities with 2+ DTRs and 2+ pulses.  Lymph: No lymphadenopathy of posterior or anterior cervical chain or axillae bilaterally.  Gait normal with good balance and coordination.  MSK:  Non tender with full range of motion and good stability and symmetric strength and tone of shoulders, elbows, , hip, knee and ankles bilaterally. Mild arthritic changes of multiple joints. Wrist: Right Inspection normal with no visible erythema or swelling. ROM smooth and normal with good  flexion and extension and ulnar/radial deviation that is symmetrical with opposite wrist. Palpation is normal over metacarpals, navicular, lunate, and TFCC; tendons without tenderness/ swelling No snuffbox tenderness. No tenderness over Canal of Guyon. Strength 5/5 in all directions without pain. Positive Finkelstein, negative tinel's and phalens. Very mild positive grind test Negative Watson's test.  MSK US performed of: Right wrist This study was ordered, performed, and interpreted by Charlann Boxer  D.O.  Wrist: Limited ultrasound the patient's right wrist shows the patient does have a large dorsal ganglion cyst that is nontender with compression. Patient was also found to have mild to moderate osteophytic changes of the [facility] joint. Significant swelling of the tendon sheath of the abductor pollicis longus tendon   IMPRESSION:  De Quervain's tenosynovitis, mild to moderate arthritic changes of the [facility] joint, ganglion cyst  Procedure: Real-time Ultrasound Guided Injection of right Abductor pollicis longs tendon sheath Device: GE Logiq E  Ultrasound guided injection is preferred based studies that show increased duration, increased effect, greater accuracy, decreased procedural pain, increased response rate with ultrasound guided versus blind injection.  Verbal informed consent obtained.  Time-out conducted.  Noted no overlying erythema, induration, or other signs of local infection.  Skin prepped in a sterile fashion.  Local anesthesia: Topical Ethyl chloride.  With sterile technique and under real time ultrasound guidance:  tendon visualized.  23g 5/8 inch needle inserted distal to proximal approach into tendon sheath. Pictures taken  for needle placement. Patient did have injection of 0.5 cc of 0.5% Marcaine, and 0.5 cc of Kenalog 40 mg/dL. Completed without difficulty  Pain immediately resolved suggesting accurate placement of the medication.  Advised to call if fevers/chills, erythema, induration, drainage, or persistent bleeding.  Images permanently stored and available for review in the ultrasound unit.  Impression: Technically successful ultrasound guided injection.  Procedure note [id number]; 15 minutes spent for Therapeutic exercises as stated in above notes.  This included exercises focusing on stretching, strengthening, with significant focus on eccentric aspects.  Flexion and extension exercises working on opposition strengthening. Eccentric exercises given. Proper technique shown  and discussed handout in great detail with ATC.  All questions were discussed and answered.     Impression and Recommendations:     This case required medical decision making of moderate complexity.      Note: This dictation was prepared with Dragon dictation along with smaller phrase technology. Any transcriptional errors that result from this process are unintentional.

## 2016-06-10 ENCOUNTER — Ambulatory Visit: Payer: Self-pay

## 2016-06-10 ENCOUNTER — Ambulatory Visit (INDEPENDENT_AMBULATORY_CARE_PROVIDER_SITE_OTHER): Payer: PPO | Admitting: Family Medicine

## 2016-06-10 ENCOUNTER — Encounter: Payer: Self-pay | Admitting: Family Medicine

## 2016-06-10 VITALS — BP 132/80 | Ht 66.0 in | Wt 190.0 lb

## 2016-06-10 DIAGNOSIS — M654 Radial styloid tenosynovitis [de Quervain]: Secondary | ICD-10-CM | POA: Diagnosis not present

## 2016-06-10 DIAGNOSIS — M79644 Pain in right finger(s): Principal | ICD-10-CM

## 2016-06-10 DIAGNOSIS — M67431 Ganglion, right wrist: Secondary | ICD-10-CM | POA: Insufficient documentation

## 2016-06-10 DIAGNOSIS — M19049 Primary osteoarthritis, unspecified hand: Secondary | ICD-10-CM | POA: Diagnosis not present

## 2016-06-10 DIAGNOSIS — G8929 Other chronic pain: Secondary | ICD-10-CM

## 2016-06-10 NOTE — Patient Instructions (Signed)
Good to see you  Ice 20 minutes 2 times daily. Usually after activity and before bed. pennsaid pinkie amount topically 2 times daily as needed.  Wear brace day and night for 2 weeks then nightly for 2 weeks.  Injected the thumb tendon today and should help.  Exercises 3 times a week.  If not better in 3 weeks we will drain the ganglion cyst there as well.

## 2016-06-10 NOTE — Assessment & Plan Note (Signed)
Patient was given an injection and tolerated the procedure well. We discussed icing regimen and home exercises. We discussed which activities to do a which was to avoid. Patient will continue to be active otherwise. We discussed icing regimen. Braced and immobilized for the next week 23 hours and then nightly for another 2 weeks. Follow-up again in 3 weeks.

## 2016-06-10 NOTE — Assessment & Plan Note (Signed)
If still having pain at follow-up we discussed attempting aspiration.

## 2016-07-03 NOTE — Progress Notes (Deleted)
[facility] [address], [place] [address] Phone: [phone] Subjective:    I'm seeing this patient by the request  of:  Nyoka Cowden, MD   CC: Right thumb pain f/u   [id number]  Danielle Morse is a 66 y.o. female coming in with complaint of right thumb pain. 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 Patient was found to have more of a de Quervain's tenosynovitis. Patient was given an injection. Was to do bracing, home exercises, icing and topical anti-inflammatories. Patient also had a ganglion cyst that no intervention was done.Patient states     Past Medical History:  Diagnosis Date  . Anxiety   . Depression   . DVT (deep venous thrombosis) ([place])    History over 30 yrs ago, no further problems  . HYPERLIPIDEMIA 06/05/2008  . MENOPAUSAL SYNDROME 06/05/2008   Past Surgical History:  Procedure Laterality Date  . ABDOMINAL HYSTERECTOMY    . CESAREAN SECTION     x 2  . COLONOSCOPY  2005  . CYSTECTOMY     left elbow  . LAPAROSCOPIC OVARIAN CYSTECTOMY     Social History   Social History  . Marital status: Single    Spouse name: N/A  . Number of children: N/A  . Years of education: N/A   Social History Main Topics  . Smoking status: Never Smoker  . Smokeless tobacco: Never Used  . Alcohol use No  . Drug use: No  . Sexual activity: Not on file   Other Topics Concern  . Not on file   Social History Narrative  . No narrative on file   No Known Allergies Family History  Problem Relation Age of Onset  . Heart disease Father   . Cancer Sister   . Colon cancer Neg Hx     Past medical history, social, surgical and family history all reviewed in electronic medical record.  No pertanent information unless stated regarding to the chief complaint.   Review of Systems:Review of systems updated and as accurate as of 07/03/16  No headache, visual changes, nausea, vomiting, diarrhea,  constipation, dizziness, abdominal pain, skin rash, fevers, chills, night sweats, weight loss, swollen lymph nodes, body aches, joint swelling, muscle aches, chest pain, shortness of breath, mood changes.   Objective  There were no vitals taken for this visit. Systems examined below as of 07/03/16   General: No apparent distress alert and oriented x3 mood and affect normal, dressed appropriately.  HEENT: Pupils equal, extraocular movements intact  Respiratory: Patient's speak in full sentences and does not appear short of breath  Cardiovascular: No lower extremity edema, non tender, no erythema  Skin: Warm dry intact with no signs of infection or rash on extremities or on axial skeleton.  Abdomen: Soft nontender  Neuro: Cranial nerves II through XII are intact, neurovascularly intact in all extremities with 2+ DTRs and 2+ pulses.  Lymph: No lymphadenopathy of posterior or anterior cervical chain or axillae bilaterally.  Gait normal with good balance and coordination.  MSK:  Non tender with full range of motion and good stability and symmetric strength and tone of shoulders, elbows, , hip, knee and ankles bilaterally. Mild arthritic changes of multiple joints. Wrist: Right Inspection normal with no visible erythema or swelling. ROM smooth and normal with good flexion and extension and ulnar/radial deviation that is symmetrical with opposite wrist. Palpation is normal over metacarpals, navicular, lunate, and TFCC; tendons without tenderness/ swelling  No snuffbox tenderness. No tenderness over Canal of Guyon. Strength 5/5 in all directions without pain. Positive Finkelstein, negative tinel's and phalens. Very mild positive grind test Negative Watson's test.  MSK US performed of: Right wrist This study was ordered, performed, and interpreted by Charlann Boxer D.O.  Wrist: Limited ultrasound the patient's right wrist shows the patient does have a large dorsal ganglion cyst that is nontender with  compression. Patient was also found to have mild to moderate osteophytic changes of the [facility] joint. Significant swelling of the tendon sheath of the abductor pollicis longus tendon   IMPRESSION:  De Quervain's tenosynovitis, mild to moderate arthritic changes of the [facility] joint, ganglion cyst  Procedure: Real-time Ultrasound Guided Injection of right Abductor pollicis longs tendon sheath Device: GE Logiq E  Ultrasound guided injection is preferred based studies that show increased duration, increased effect, greater accuracy, decreased procedural pain, increased response rate with ultrasound guided versus blind injection.  Verbal informed consent obtained.  Time-out conducted.  Noted no overlying erythema, induration, or other signs of local infection.  Skin prepped in a sterile fashion.  Local anesthesia: Topical Ethyl chloride.  With sterile technique and under real time ultrasound guidance:  tendon visualized.  23g 5/8 inch needle inserted distal to proximal approach into tendon sheath. Pictures taken  for needle placement. Patient did have injection of 0.5 cc of 0.5% Marcaine, and 0.5 cc of Kenalog 40 mg/dL. Completed without difficulty  Pain immediately resolved suggesting accurate placement of the medication.  Advised to call if fevers/chills, erythema, induration, drainage, or persistent bleeding.  Images permanently stored and available for review in the ultrasound unit.  Impression: Technically successful ultrasound guided injection.    Impression and Recommendations:     This case required medical decision making of moderate complexity.      Note: This dictation was prepared with Dragon dictation along with smaller phrase technology. Any transcriptional errors that result from this process are unintentional.

## 2016-07-04 ENCOUNTER — Ambulatory Visit: Payer: PPO | Admitting: Family Medicine

## 2016-07-06 ENCOUNTER — Ambulatory Visit: Payer: Self-pay

## 2016-07-06 ENCOUNTER — Ambulatory Visit (INDEPENDENT_AMBULATORY_CARE_PROVIDER_SITE_OTHER): Payer: PPO | Admitting: Family Medicine

## 2016-07-06 ENCOUNTER — Encounter: Payer: Self-pay | Admitting: Family Medicine

## 2016-07-06 VITALS — BP 142/72 | HR 60 | Ht 66.0 in | Wt 189.0 lb

## 2016-07-06 DIAGNOSIS — M79644 Pain in right finger(s): Secondary | ICD-10-CM

## 2016-07-06 DIAGNOSIS — G8929 Other chronic pain: Secondary | ICD-10-CM | POA: Diagnosis not present

## 2016-07-06 DIAGNOSIS — M654 Radial styloid tenosynovitis [de Quervain]: Secondary | ICD-10-CM

## 2016-07-06 DIAGNOSIS — M67431 Ganglion, right wrist: Secondary | ICD-10-CM | POA: Diagnosis not present

## 2016-07-06 NOTE — Progress Notes (Signed)
[facility] [address] [place], [place] [address] Phone: [phone] Subjective:    I'm seeing this patient by the request  of:  Danielle Cowden, MD   CC: Right thumb pain f/u   [id number]  Danielle Morse is a 66 y.o. female coming in with complaint of right thumb pain. 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 Patient was found to have more of a de Quervain's tenosynovitis. Patient was given an injection. Was to do bracing, home exercises, icing and topical anti-inflammatories. Patient also had a ganglion cyst that no intervention was done.Patient states Doing a proximal a 90% better. Patient states that she is no longer having the severe pain. Happy with the results of far. Continues to wear the brace fairly regularly. He is able to do daily activities without any pain.      Past Medical History:  Diagnosis Date  . Anxiety   . Depression   . DVT (deep venous thrombosis) ([place])    History over 30 yrs ago, no further problems  . HYPERLIPIDEMIA 06/05/2008  . MENOPAUSAL SYNDROME 06/05/2008   Past Surgical History:  Procedure Laterality Date  . ABDOMINAL HYSTERECTOMY    . CESAREAN SECTION     x 2  . COLONOSCOPY  2005  . CYSTECTOMY     left elbow  . LAPAROSCOPIC OVARIAN CYSTECTOMY     Social History   Social History  . Marital status: Single    Spouse name: N/A  . Number of children: N/A  . Years of education: N/A   Social History Main Topics  . Smoking status: Never Smoker  . Smokeless tobacco: Never Used  . Alcohol use No  . Drug use: No  . Sexual activity: Not Asked   Other Topics Concern  . None   Social History Narrative  . None   No Known Allergies Family History  Problem Relation Age of Onset  . Heart disease Father   . Cancer Sister   . Colon cancer Neg Hx     Past medical history, social, surgical and family history all reviewed in electronic medical record.  No pertanent  information unless stated regarding to the chief complaint.   Review of Systems: No headache, visual changes, nausea, vomiting, diarrhea, constipation, dizziness, abdominal pain, skin rash, fevers, chills, night sweats, weight loss, swollen lymph nodes, body aches, joint swelling, muscle aches, chest pain, shortness of breath, mood changes.    Objective  Blood pressure (!) 142/72, pulse 60, height 5\' 6"  (1.676 m), weight 189 lb (85.7 kg).   Systems examined below as of 07/06/16 General: NAD A&O x3 mood, affect normal  HEENT: Pupils equal, extraocular movements intact no nystagmus Respiratory: not short of breath at rest or with speaking Cardiovascular: No lower extremity edema, non tender Skin: Warm dry intact with no signs of infection or rash on extremities or on axial skeleton. Abdomen: Soft nontender, no masses Neuro: Cranial nerves  intact, neurovascularly intact in all extremities with 2+ DTRs and 2+ pulses. Lymph: No lymphadenopathy appreciated today  Gait normal with good balance and coordination.  MSK: Non tender with full range of motion and good stability and symmetric strength and tone of shoulders, elbows, wrist,  knee hips and ankles bilaterally.   MSK:  Non tender with full range of motion and good stability and symmetric strength and tone of shoulders, elbows, , hip, knee and ankles bilaterally. Mild arthritic changes of multiple joints. Wrist: Right Inspection  normal with no visible erythema or swelling. Ganglion cyst is noted dorsal aspect of the wrist ROM smooth and normal with good flexion and extension and ulnar/radial deviation that is symmetrical with opposite wrist. Palpation is normal over metacarpals, navicular, lunate, and TFCC; tendons without tenderness/ swelling No snuffbox tenderness. No tenderness over Canal of Guyon. Strength 5/5 in all directions without pain. Negative Finkelstein, negative tinel's and phalens. Negative grind test Negative Watson's  test.     Impression and Recommendations:     This case required medical decision making of moderate complexity.      Note: This dictation was prepared with Dragon dictation along with smaller phrase technology. Any transcriptional errors that result from this process are unintentional.

## 2016-07-06 NOTE — Patient Instructions (Signed)
goodto see you  Exercises 2 times a week  Ice is your friend Brace at night only for 2 weeks See me again when you need me

## 2016-07-06 NOTE — Assessment & Plan Note (Signed)
Much better after the injection. Patient did basis knowing that we can repeat every 3-4 months if necessary. Encourage her to do the exercises regularly. Bracing when needed. Follow-up again as needed.

## 2016-07-06 NOTE — Assessment & Plan Note (Signed)
Continue to monitor

## 2016-07-20 ENCOUNTER — Other Ambulatory Visit: Payer: Self-pay | Admitting: Internal Medicine

## 2016-07-20 DIAGNOSIS — Z1231 Encounter for screening mammogram for malignant neoplasm of breast: Secondary | ICD-10-CM

## 2016-07-21 ENCOUNTER — Ambulatory Visit (INDEPENDENT_AMBULATORY_CARE_PROVIDER_SITE_OTHER): Payer: PPO | Admitting: Family Medicine

## 2016-07-21 ENCOUNTER — Encounter: Payer: Self-pay | Admitting: Family Medicine

## 2016-07-21 VITALS — BP 142/80 | HR 94 | Temp 98.0°F | Resp 12 | Ht 66.0 in | Wt 192.2 lb

## 2016-07-21 DIAGNOSIS — N941 Unspecified dyspareunia: Secondary | ICD-10-CM

## 2016-07-21 DIAGNOSIS — N898 Other specified noninflammatory disorders of vagina: Secondary | ICD-10-CM | POA: Diagnosis not present

## 2016-07-21 DIAGNOSIS — I10 Essential (primary) hypertension: Secondary | ICD-10-CM | POA: Diagnosis not present

## 2016-07-21 DIAGNOSIS — N952 Postmenopausal atrophic vaginitis: Secondary | ICD-10-CM

## 2016-07-21 MED ORDER — ESTROGENS, CONJUGATED 0.625 MG/GM VA CREA: 1.0000 | TOPICAL_CREAM | VAGINAL | 2 refills | Status: DC

## 2016-07-21 NOTE — Progress Notes (Signed)
HPI:   ACUTE VISIT:  Chief Complaint  Patient presents with  . Vaginitis    Danielle Morse is a 66 y.o. female, who is here today complaining of 3-4 days of vaginal "irritation" with sex intercourse.She states that she is not sure if this is something new but she has noted it this time for 3-4 days. She feels like she has a "scratch" on affected area. Not sure about alleviating factors.  She does not take more baths than usual,no new soap or other possible causal factor.  S/P hysterectomy. Hx of atrophic vaginitis.   She denies vaginal pruritus, discharge or bleeding.  She is sexually active, almost daily sex intercourse, same sex partner for 10 years. She was treated for STD 30+ years ago.  Denies fever,chills,abdominal pain,nausea,vomiting,or urinary symptoms. She has not tried OTC medications.  Symptoms are stable.  -BP mildly elevated today. Hx of HTN,she is on HCTZ 25 mg daily. She does not check BP at home.   Review of Systems  Constitutional: Negative for appetite change, chills, fatigue and fever.  HENT: Negative for mouth sores and sore throat.   Gastrointestinal: Negative for abdominal pain, nausea and vomiting.       No changes in bowel habits.  Genitourinary: Positive for dyspareunia. Negative for decreased urine volume, dysuria, genital sores, hematuria, pelvic pain, vaginal bleeding and vaginal discharge.  Musculoskeletal: Negative for back pain and myalgias.  Skin: Negative for rash.  Neurological: Negative for weakness and headaches.  Psychiatric/Behavioral: Negative for confusion. The patient is not nervous/anxious.       Current Outpatient Prescriptions on File Prior to Visit  Medication Sig Dispense Refill  . cholecalciferol (VITAMIN D) 1000 UNITS tablet Take 1,000 Units by mouth daily.    . hydrochlorothiazide (HYDRODIURIL) 25 MG tablet TAKE 1 TABLET (25 MG TOTAL) BY MOUTH DAILY. 90 tablet 1  . Multiple Vitamin (MULTIVITAMIN) tablet  Take 1 tablet by mouth daily.     No current facility-administered medications on file prior to visit.      Past Medical History:  Diagnosis Date  . Anxiety   . Depression   . DVT (deep venous thrombosis) ([place])    History over 30 yrs ago, no further problems  . HYPERLIPIDEMIA 06/05/2008  . MENOPAUSAL SYNDROME 06/05/2008   No Known Allergies  Social History   Social History  . Marital status: Single    Spouse name: N/A  . Number of children: N/A  . Years of education: N/A   Social History Main Topics  . Smoking status: Never Smoker  . Smokeless tobacco: Never Used  . Alcohol use No  . Drug use: No  . Sexual activity: Not Asked   Other Topics Concern  . None   Social History Narrative  . None    Vitals:   07/21/16 0953  BP: (!) 142/80  Pulse: 94  Resp: 12  Temp: 98 F (36.7 C)   Body mass index is 31.03 kg/m.   Physical Exam  Nursing note and vitals reviewed. Constitutional: She is oriented to person, place, and time. She appears well-developed. No distress.  HENT:  Head: Atraumatic.  Eyes: Conjunctivae and EOM are normal.  Respiratory: Effort normal and breath sounds normal. No respiratory distress.  GI: Soft. She exhibits no mass. There is no hepatomegaly. There is no tenderness. There is no CVA tenderness.  Genitourinary: There is no tenderness or lesion on the right labia. There is no tenderness or lesion on the left labia. There is  erythema in the vagina. Vaginal discharge found.  Genitourinary Comments: Mild vaginal vault erythema and whitish vaginal discharge. Cervix is absent. No edema or bleeding.   Musculoskeletal: She exhibits no edema.  Lymphadenopathy:    She has no cervical adenopathy.  Neurological: She is alert and oriented to person, place, and time. Coordination and gait normal.  Skin: Skin is warm. No rash noted. No erythema.  Psychiatric: She has a normal mood and affect.  Well groomed, good eye contact.      ASSESSMENT AND  PLAN:   [name] was seen today for vaginitis.  Diagnoses and all orders for this visit:  Dyspareunia in female  Possible etiologies discussed. Hx of atrophic vaginitis. Further recommendations will be given according to lab results.  -     Chlamydia/Gonococcus/Trichomonas, NAA  Vaginal discharge  Mild. Further recommendations will be given according to lab results.  -     Chlamydia/Gonococcus/Trichomonas, NAA  Atrophic vaginitis  After discussion of side effects she agrees with trying topical Estrogen cream. F/U with PCP in 2-3 months,before if needed.  -     conjugated estrogens (PREMARIN) vaginal cream; Place 1 Applicatorful vaginally 3 (three) times a week.  Essential hypertension  SBP mildly elevated. Recommend monitor BP at home. No changes in management. Continue following with PCP.     Return in about 3 months (around 10/21/2016) for PCP vaginitis.     -[name] was advised to return or notify a doctor immediately if symptoms worsen or new concerns arise.       Lyana Asbill G. Martinique, MD  [facility]. [place] office.

## 2016-07-21 NOTE — Patient Instructions (Addendum)
  [name] I have seen you today for an acute visit.  A few things to remember from today's visit:   Dyspareunia in female - Plan: Chlamydia/Gonococcus/Trichomonas, NAA  Atrophic vaginitis - Plan: conjugated estrogens (PREMARIN) vaginal cream         Medications prescribed today are intended for short period of time and will not be refill upon request, a follow up appointment might be necessary to discuss continuation of of treatment if appropriate.  Please monitor blood pressure.    In general please monitor for signs of worsening symptoms and seek immediate medical attention if any concerning. Follow up in 3 months. Please be sure you have an appointment already scheduled with your PCP before you leave today.

## 2016-07-21 NOTE — Progress Notes (Signed)
Pre visit review using our clinic review tool, if applicable. No additional management support is needed unless otherwise documented below in the visit note. 

## 2016-07-24 LAB — CHLAMYDIA/GONOCOCCUS/TRICHOMONAS, NAA
Chlamydia by NAA: NEGATIVE
Gonococcus by NAA: NEGATIVE
Trich vag by NAA: NEGATIVE

## 2016-08-26 ENCOUNTER — Ambulatory Visit
Admission: RE | Admit: 2016-08-26 | Discharge: 2016-08-26 | Disposition: A | Discharge status: Home / Self Care | Payer: PPO | Source: Ambulatory Visit | Attending: Internal Medicine | Admitting: Internal Medicine

## 2016-08-26 DIAGNOSIS — Z1231 Encounter for screening mammogram for malignant neoplasm of breast: Secondary | ICD-10-CM | POA: Diagnosis not present

## 2016-09-16 ENCOUNTER — Other Ambulatory Visit: Payer: Self-pay | Admitting: Internal Medicine

## 2016-10-20 NOTE — Progress Notes (Signed)
HPI:   Danielle Morse is a 66 y.o. female, who is here today to follow on her OV on 07/21/16, when she was c/o dyspareunia. Premarin cream was recommended, she is reporting resolution of symptoms but she cannot afford medication. She would like to discuss other treatment options.  She used Premarin cream 2-3 times per week until a few weeks ago.  She denies any abdominal pain, urinary symptoms, vaginal bleeding/discharge. She has not noted breast tenderness or nipple discharge.  S/P hysterectomy.  She has an appt with her PCP for her routine physical in 12/2016.  Mammogram on 08/26/16 [facility] 1.  Review of Systems  Constitutional: Negative for activity change, appetite change, fatigue and fever.  Respiratory: Negative for chest tightness and shortness of breath.   Cardiovascular: Negative for leg swelling.  Gastrointestinal: Negative for abdominal pain, nausea and vomiting.  Genitourinary: Negative for decreased urine volume, dysuria, frequency, hematuria, vaginal bleeding and vaginal discharge.  Musculoskeletal: Negative for back pain and myalgias.  Skin: Negative for rash.  Neurological: Negative for weakness and headaches.     Current Outpatient Prescriptions on File Prior to Visit  Medication Sig Dispense Refill  . cholecalciferol (VITAMIN D) 1000 UNITS tablet Take 1,000 Units by mouth daily.    . hydrochlorothiazide (HYDRODIURIL) 25 MG tablet TAKE 1 TABLET (25 MG TOTAL) BY MOUTH DAILY. 90 tablet 1  . Multiple Vitamin (MULTIVITAMIN) tablet Take 1 tablet by mouth daily.     No current facility-administered medications on file prior to visit.      Past Medical History:  Diagnosis Date  . Anxiety   . Depression   . DVT (deep venous thrombosis) ([place])    History over 30 yrs ago, no further problems  . HYPERLIPIDEMIA 06/05/2008  . MENOPAUSAL SYNDROME 06/05/2008   No Known Allergies  Social History   Social History  . Marital status: Single    Spouse name:  N/A  . Number of children: N/A  . Years of education: N/A   Social History Main Topics  . Smoking status: Never Smoker  . Smokeless tobacco: Never Used  . Alcohol use No  . Drug use: No  . Sexual activity: Not Asked   Other Topics Concern  . None   Social History Narrative  . None    Vitals:   10/21/16 1338  BP: 110/70  Pulse: 78  Resp: 12   Body mass index is 30.91 kg/m.   Physical Exam  Nursing note and vitals reviewed. Constitutional: She is oriented to person, place, and time. She appears well-developed. No distress.  HENT:  Head: Atraumatic.  Eyes: Conjunctivae and EOM are normal.  Cardiovascular: Normal rate and regular rhythm.   No murmur heard. Respiratory: Effort normal and breath sounds normal. No respiratory distress.  GI: Soft. She exhibits no mass. There is no hepatomegaly. There is no tenderness.  Musculoskeletal: She exhibits no edema or tenderness.  Neurological: She is alert and oriented to person, place, and time. She has normal strength. Gait normal.  Skin: Skin is warm. No erythema.  Psychiatric: She has a normal mood and affect.  Well groomed, good eye contact.     ASSESSMENT AND PLAN:    Kory was seen today for follow-up.  Diagnoses and all orders for this visit:  Atrophic vaginitis -     estradiol (ESTRACE) 0.1 MG/GM vaginal cream; Place 1 Applicatorful vaginally 3 (three) times a week.   Estrace vaginal cream may be cheaper, she agrees with trying. Other  treatment options as topical testosterone, DHEA, oral Ospemifene, and laser treatments can be discussed with PCP if necessary. She understands side effects. Keep next appt with PCP.    15 min face to face OV. > 50% was dedicated to discussion of Dx, treatment options, and some side effects of hormonal therapy.    -Ms. Evangelyn Crouse was advised to return sooner than planned today if new concerns arise.       Betty G. Martinique, MD  [facility]. [place]  office.

## 2016-10-21 ENCOUNTER — Encounter: Payer: Self-pay | Admitting: Family Medicine

## 2016-10-21 ENCOUNTER — Ambulatory Visit (INDEPENDENT_AMBULATORY_CARE_PROVIDER_SITE_OTHER): Payer: PPO | Admitting: Family Medicine

## 2016-10-21 DIAGNOSIS — N952 Postmenopausal atrophic vaginitis: Secondary | ICD-10-CM

## 2016-10-21 MED ORDER — ESTRADIOL 0.1 MG/GM VA CREA: 1.0000 | TOPICAL_CREAM | VAGINAL | 2 refills | Status: DC

## 2016-10-21 NOTE — Patient Instructions (Signed)
A few things to remember from today's visit:   Atrophic vaginitis - Plan: estradiol (ESTRACE) 0.1 MG/GM vaginal cream  Keep appt with PCP in 12/2016.   Please be sure medication list is accurate. If a new problem present, please set up appointment sooner than planned today.

## 2016-12-07 ENCOUNTER — Encounter: Payer: Self-pay | Admitting: Internal Medicine

## 2016-12-07 ENCOUNTER — Ambulatory Visit (INDEPENDENT_AMBULATORY_CARE_PROVIDER_SITE_OTHER): Payer: PPO | Admitting: Internal Medicine

## 2016-12-07 VITALS — BP 122/62 | HR 70 | Temp 98.1°F | Ht 66.0 in | Wt 189.2 lb

## 2016-12-07 DIAGNOSIS — E785 Hyperlipidemia, unspecified: Secondary | ICD-10-CM

## 2016-12-07 DIAGNOSIS — I10 Essential (primary) hypertension: Secondary | ICD-10-CM

## 2016-12-07 DIAGNOSIS — Z0000 Encounter for general adult medical examination without abnormal findings: Secondary | ICD-10-CM | POA: Diagnosis not present

## 2016-12-07 LAB — LIPID PANEL
Cholesterol: 215 mg/dL — ABNORMAL HIGH (ref 0–200)
HDL: 33.1 mg/dL — ABNORMAL LOW (ref >39.00)
NONHDL: 181.54
Total CHOL/HDL Ratio: 6
Triglycerides: 244 mg/dL — ABNORMAL HIGH (ref 0.0–149.0)
VLDL: 48.8 mg/dL — ABNORMAL HIGH (ref 0.0–40.0)

## 2016-12-07 LAB — CBC WITH DIFFERENTIAL/PLATELET
Basophils Absolute: 0 10*3/uL (ref 0.0–0.1)
Basophils Relative: 0.8 % (ref 0.0–3.0)
EOS PCT: 2.4 % (ref 0.0–5.0)
Eosinophils Absolute: 0.1 10*3/uL (ref 0.0–0.7)
HCT: 39.1 % (ref 36.0–46.0)
Hemoglobin: 13.1 g/dL (ref 12.0–15.0)
LYMPHS ABS: 2.3 10*3/uL (ref 0.7–4.0)
Lymphocytes Relative: 45.2 % (ref 12.0–46.0)
MCHC: 33.5 g/dL (ref 30.0–36.0)
MCV: 92 fl (ref 78.0–100.0)
Monocytes Absolute: 0.3 10*3/uL (ref 0.1–1.0)
Monocytes Relative: 6.6 % (ref 3.0–12.0)
NEUTROS PCT: 45 % (ref 43.0–77.0)
Neutro Abs: 2.2 10*3/uL (ref 1.4–7.7)
Platelets: 298 10*3/uL (ref 150.0–400.0)
RBC: 4.25 Mil/uL (ref 3.87–5.11)
RDW: 14.2 % (ref 11.5–15.5)
WBC: 5 10*3/uL (ref 4.0–10.5)

## 2016-12-07 LAB — COMPREHENSIVE METABOLIC PANEL
ALK PHOS: 74 U/L (ref 39–117)
ALT: 12 U/L (ref 0–35)
AST: 10 U/L (ref 0–37)
Albumin: 4.4 g/dL (ref 3.5–5.2)
BILIRUBIN TOTAL: 0.5 mg/dL (ref 0.2–1.2)
BUN: 11 mg/dL (ref 6–23)
CO2: 33 mEq/L — ABNORMAL HIGH (ref 19–32)
CREATININE: 0.71 mg/dL (ref 0.40–1.20)
Calcium: 9.5 mg/dL (ref 8.4–10.5)
Chloride: 100 mEq/L (ref 96–112)
GFR: 106.07 mL/min (ref >60.00)
GLUCOSE: 108 mg/dL — AB (ref 70–99)
POTASSIUM: 3.9 meq/L (ref 3.5–5.1)
SODIUM: 138 meq/L (ref 135–145)
TOTAL PROTEIN: 6.9 g/dL (ref 6.0–8.3)

## 2016-12-07 LAB — LDL CHOLESTEROL, DIRECT: LDL DIRECT: 111 mg/dL

## 2016-12-07 LAB — TSH: TSH: 0.74 u[IU]/mL (ref 0.35–4.50)

## 2016-12-07 MED ORDER — HYDROCHLOROTHIAZIDE 25 MG PO TABS: 25.0000 mg | ORAL_TABLET | Freq: Every day | ORAL | 4 refills | Status: DC

## 2016-12-07 NOTE — Patient Instructions (Addendum)
Limit your sodium (Salt) intake  Please check your blood pressure on a regular basis.  If it is consistently greater than 150/90, please make an office appointment.    It is important that you exercise regularly, at least 20 minutes 3 to 4 times per week.  If you develop chest pain or shortness of breath seek  medical attention.   Health Maintenance for Postmenopausal Women Menopause is a normal process in which your reproductive ability comes to an end. This process happens gradually over a span of months to years, usually between the ages of 61 and 52. Menopause is complete when you have missed 12 consecutive menstrual periods. It is important to talk with your health care provider about some of the most common conditions that affect postmenopausal women, such as heart disease, cancer, and bone loss (osteoporosis). Adopting a healthy lifestyle and getting preventive care can help to promote your health and wellness. Those actions can also lower your chances of developing some of these common conditions. What should I know about menopause? During menopause, you may experience a number of symptoms, such as:  Moderate-to-severe hot flashes.  Night sweats.  Decrease in sex drive.  Mood swings.  Headaches.  Tiredness.  Irritability.  Memory problems.  Insomnia.  Choosing to treat or not to treat menopausal changes is an individual decision that you make with your health care provider. What should I know about hormone replacement therapy and supplements? Hormone therapy products are effective for treating symptoms that are associated with menopause, such as hot flashes and night sweats. Hormone replacement carries certain risks, especially as you become older. If you are thinking about using estrogen or estrogen with progestin treatments, discuss the benefits and risks with your health care provider. What should I know about heart disease and stroke? Heart disease, heart attack, and  stroke become more likely as you age. This may be due, in part, to the hormonal changes that your body experiences during menopause. These can affect how your body processes dietary fats, triglycerides, and cholesterol. Heart attack and stroke are both medical emergencies. There are many things that you can do to help prevent heart disease and stroke:  Have your blood pressure checked at least every 1-2 years. High blood pressure causes heart disease and increases the risk of stroke.  If you are 2-48 years old, ask your health care provider if you should take aspirin to prevent a heart attack or a stroke.  Do not use any tobacco products, including cigarettes, chewing tobacco, or electronic cigarettes. If you need help quitting, ask your health care provider.  It is important to eat a healthy diet and maintain a healthy weight. ? Be sure to include plenty of vegetables, fruits, low-fat dairy products, and lean protein. ? Avoid eating foods that are high in solid fats, added sugars, or salt (sodium).  Get regular exercise. This is one of the most important things that you can do for your health. ? Try to exercise for at least 150 minutes each week. The type of exercise that you do should increase your heart rate and make you sweat. This is known as moderate-intensity exercise. ? Try to do strengthening exercises at least twice each week. Do these in addition to the moderate-intensity exercise.  Know your numbers.Ask your health care provider to check your cholesterol and your blood glucose. Continue to have your blood tested as directed by your health care provider.  What should I know about cancer screening? There are several  types of cancer. Take the following steps to reduce your risk and to catch any cancer development as early as possible. Breast Cancer  Practice breast self-awareness. ? This means understanding how your breasts normally appear and feel. ? It also means doing regular  breast self-exams. Let your health care provider know about any changes, no matter how small.  If you are 83 or older, have a clinician do a breast exam (clinical breast exam or CBE) every year. Depending on your age, family history, and medical history, it may be recommended that you also have a yearly breast X-ray (mammogram).  If you have a family history of breast cancer, talk with your health care provider about genetic screening.  If you are at high risk for breast cancer, talk with your health care provider about having an MRI and a mammogram every year.  Breast cancer (BRCA) gene test is recommended for women who have family members with BRCA-related cancers. Results of the assessment will determine the need for genetic counseling and BRCA1 and for BRCA2 testing. BRCA-related cancers include these types: ? Breast. This occurs in males or females. ? Ovarian. ? Tubal. This may also be called fallopian tube cancer. ? Cancer of the abdominal or pelvic lining (peritoneal cancer). ? Prostate. ? Pancreatic.  Cervical, Uterine, and Ovarian Cancer Your health care provider may recommend that you be screened regularly for cancer of the pelvic organs. These include your ovaries, uterus, and vagina. This screening involves a pelvic exam, which includes checking for microscopic changes to the surface of your cervix (Pap test).  For women ages 21-65, health care providers may recommend a pelvic exam and a Pap test every three years. For women ages 71-65, they may recommend the Pap test and pelvic exam, combined with testing for human papilloma virus (HPV), every five years. Some types of HPV increase your risk of cervical cancer. Testing for HPV may also be done on women of any age who have unclear Pap test results.  Other health care providers may not recommend any screening for nonpregnant women who are considered low risk for pelvic cancer and have no symptoms. Ask your health care provider if a  screening pelvic exam is right for you.  If you have had past treatment for cervical cancer or a condition that could lead to cancer, you need Pap tests and screening for cancer for at least 20 years after your treatment. If Pap tests have been discontinued for you, your risk factors (such as having a new sexual partner) need to be reassessed to determine if you should start having screenings again. Some women have medical problems that increase the chance of getting cervical cancer. In these cases, your health care provider may recommend that you have screening and Pap tests more often.  If you have a family history of uterine cancer or ovarian cancer, talk with your health care provider about genetic screening.  If you have vaginal bleeding after reaching menopause, tell your health care provider.  There are currently no reliable tests available to screen for ovarian cancer.  Lung Cancer Lung cancer screening is recommended for adults 102-66 years old who are at high risk for lung cancer because of a history of smoking. A yearly low-dose CT scan of the lungs is recommended if you:  Currently smoke.  Have a history of at least 30 pack-years of smoking and you currently smoke or have quit within the past 15 years. A pack-year is smoking an average of one  pack of cigarettes per day for one year.  Yearly screening should:  Continue until it has been 15 years since you quit.  Stop if you develop a health problem that would prevent you from having lung cancer treatment.  Colorectal Cancer  This type of cancer can be detected and can often be prevented.  Routine colorectal cancer screening usually begins at age 29 and continues through age 29.  If you have risk factors for colon cancer, your health care provider may recommend that you be screened at an earlier age.  If you have a family history of colorectal cancer, talk with your health care provider about genetic screening.  Your health  care provider may also recommend using home test kits to check for hidden blood in your stool.  A small camera at the end of a tube can be used to examine your colon directly (sigmoidoscopy or colonoscopy). This is done to check for the earliest forms of colorectal cancer.  Direct examination of the colon should be repeated every 5-10 years until age 72. However, if early forms of precancerous polyps or small growths are found or if you have a family history or genetic risk for colorectal cancer, you may need to be screened more often.  Skin Cancer  Check your skin from head to toe regularly.  Monitor any moles. Be sure to tell your health care provider: ? About any new moles or changes in moles, especially if there is a change in a mole's shape or color. ? If you have a mole that is larger than the size of a pencil eraser.  If any of your family members has a history of skin cancer, especially at a young age, talk with your health care provider about genetic screening.  Always use sunscreen. Apply sunscreen liberally and repeatedly throughout the day.  Whenever you are outside, protect yourself by wearing long sleeves, pants, a wide-brimmed hat, and sunglasses.  What should I know about osteoporosis? Osteoporosis is a condition in which bone destruction happens more quickly than new bone creation. After menopause, you may be at an increased risk for osteoporosis. To help prevent osteoporosis or the bone fractures that can happen because of osteoporosis, the following is recommended:  If you are 24-59 years old, get at least 1,000 mg of calcium and at least 600 mg of vitamin D per day.  If you are older than age 54 but younger than age 55, get at least 1,200 mg of calcium and at least 600 mg of vitamin D per day.  If you are older than age 50, get at least 1,200 mg of calcium and at least 800 mg of vitamin D per day.  Smoking and excessive alcohol intake increase the risk of  osteoporosis. Eat foods that are rich in calcium and vitamin D, and do weight-bearing exercises several times each week as directed by your health care provider. What should I know about how menopause affects my mental health? Depression may occur at any age, but it is more common as you become older. Common symptoms of depression include:  Low or sad mood.  Changes in sleep patterns.  Changes in appetite or eating patterns.  Feeling an overall lack of motivation or enjoyment of activities that you previously enjoyed.  Frequent crying spells.  Talk with your health care provider if you think that you are experiencing depression. What should I know about immunizations? It is important that you get and maintain your immunizations. These include:  Tetanus, diphtheria, and pertussis (Tdap) booster vaccine.  Influenza every year before the flu season begins.  Pneumonia vaccine.  Shingles vaccine.  Your health care provider may also recommend other immunizations. This information is not intended to replace advice given to you by your health care provider. Make sure you discuss any questions you have with your health care provider. Document Released: 06/17/2005 Document Revised: 11/13/2015 Document Reviewed: 01/27/2015 Elsevier Interactive Patient Education  2018 [employer]  

## 2016-12-07 NOTE — Progress Notes (Signed)
Subjective:    Patient ID: Danielle Morse, female    DOB: 04-Sep-1950, 66 y.o.   MRN: [id number]  HPI 66 year old patient who is seen today for a preventive health examination and initial Medicare wellness visit She has a history of essential hypertension which has been well controlled on diuretic therapy She has been evaluated and treated recently for atrophic vaginitis and now is on topical estrogen cream. No concerns or complaints. She has had a recent mammogram and colonoscopy  Family history.  Father died of complications of what sounds like polycythemia and valvular heart disease.  Mother died at 67 of senile dementia.  One sister with lung cancer who was a smoker.  Otherwise no family history of cancer  Past Medical History:  Diagnosis Date  . Anxiety   . Depression   . DVT (deep venous thrombosis) (HCC)    History over 30 yrs ago, no further problems  . HYPERLIPIDEMIA 06/05/2008  . MENOPAUSAL SYNDROME 06/05/2008     Social History   Social History  . Marital status: Single    Spouse name: N/A  . Number of children: N/A  . Years of education: N/A   Occupational History  . Not on file.   Social History Main Topics  . Smoking status: Never Smoker  . Smokeless tobacco: Never Used  . Alcohol use No  . Drug use: No  . Sexual activity: Not on file   Other Topics Concern  . Not on file   Social History Narrative  . No narrative on file    Past Surgical History:  Procedure Laterality Date  . ABDOMINAL HYSTERECTOMY    . CESAREAN SECTION     x 2  . COLONOSCOPY  2005  . CYSTECTOMY     left elbow  . LAPAROSCOPIC OVARIAN CYSTECTOMY      Family History  Problem Relation Age of Onset  . Heart disease Father   . Cancer Sister   . Colon cancer Neg Hx     No Known Allergies  Current Outpatient Prescriptions on File Prior to Visit  Medication Sig Dispense Refill  . cholecalciferol (VITAMIN D) 1000 UNITS tablet Take 1,000 Units by mouth daily.    Marland Kitchen estradiol  (ESTRACE) 0.1 MG/GM vaginal cream Place 1 Applicatorful vaginally 3 (three) times a week. 42.5 g 2  . hydrochlorothiazide (HYDRODIURIL) 25 MG tablet TAKE 1 TABLET (25 MG TOTAL) BY MOUTH DAILY. 90 tablet 1  . Multiple Vitamin (MULTIVITAMIN) tablet Take 1 tablet by mouth daily.     No current facility-administered medications on file prior to visit.     BP 122/62 (BP Location: Left Arm, Patient Position: Sitting, Cuff Size: Normal)   Pulse 70   Temp 98.1 F (36.7 C) (Oral)   Ht 5\' 6"  (1.676 m)   Wt 189 lb 3.2 oz (85.8 kg)   SpO2 98%   BMI 30.54 kg/m   Initial Medicare wellness visit  1. Risk factors, based on past  M,S,F history.  Cardiac arrest.  Risk factors include hypertension only  2.  Physical activities:remains quite active.  She is retired, walks frequently and does theexercise in her home 3 times per week  3.  Depression/mood:no history of major depression or mood disorder  4.  Hearing:no deficits  5.  ADL's:independent  6.  Fall risk:low  7.  Home safety:no problems identified  8.  Height weight, and visual acuity;height and weight stable no change in visual acuity does have annual eye examinations  9.  Counseling:  continue regular exercise and home blood pressure monitoring. Continue low-salt diet.  Annual eye examinations.  Encouraged, as well as annual mammograms  10. Lab orders based on risk factors: laboratory update will be reviewed  11. Referral :not appropriate at this time  12. Care plan:continue efforts at aggressive risk factor modification  13. Cognitive assessment: alert and oriented with normal affect.  No cognitive dysfunction  14. Screening: Patient provided with a written and personalized 5-10 year screening schedule in the AVS.    15. Provider List Update: primary care sports medicine ophthalmology and radiology  16.  Advance directives.  No living will or health care power of attorney in place.  This was encouraged     Review of Systems   Constitutional: Negative.   HENT: Negative for congestion, dental problem, hearing loss, rhinorrhea, sinus pressure, sore throat and tinnitus.   Eyes: Negative for pain, discharge and visual disturbance.  Respiratory: Negative for cough and shortness of breath.   Cardiovascular: Negative for chest pain, palpitations and leg swelling.  Gastrointestinal: Negative for abdominal distention, abdominal pain, blood in stool, constipation, diarrhea, nausea and vomiting.  Genitourinary: Positive for dyspareunia and vaginal pain. Negative for difficulty urinating, dysuria, flank pain, frequency, hematuria, pelvic pain, urgency, vaginal bleeding and vaginal discharge.  Musculoskeletal: Negative for arthralgias, gait problem and joint swelling.  Skin: Negative for rash.  Neurological: Negative for dizziness, syncope, speech difficulty, weakness, numbness and headaches.  Hematological: Negative for adenopathy.  Psychiatric/Behavioral: Negative for agitation, behavioral problems and dysphoric mood. The patient is not nervous/anxious.        Objective:   Physical Exam  Constitutional: She is oriented to person, place, and time. She appears well-developed and well-nourished.  Blood pressure low normal  HENT:  Head: Normocephalic.  Right Ear: External ear normal.  Left Ear: External ear normal.  Mouth/Throat: Oropharynx is clear and moist.  Eyes: Pupils are equal, round, and reactive to light. Conjunctivae and EOM are normal.  Neck: Normal range of motion. Neck supple. No thyromegaly present.  Cardiovascular: Normal rate, regular rhythm, normal heart sounds and intact distal pulses.   Dorsalis pedis pulses faint.  Posterior tibial pulses not easily palpable  Pulmonary/Chest: Effort normal and breath sounds normal.  Abdominal: Soft. Bowel sounds are normal. She exhibits no mass. There is no tenderness.  Musculoskeletal: Normal range of motion.  Lymphadenopathy:    She has no cervical adenopathy.    Neurological: She is alert and oriented to person, place, and time.  Skin: Skin is warm and dry. No rash noted.  Psychiatric: She has a normal mood and affect. Her behavior is normal.          Assessment & Plan:   Preventive health examination Initial Medicare wellness visit  Essential hypertension.  Excellent control.   Atrophic vaginitis.  ContinueVaginal estrogen cream Osteoarthritis  Mild dyslipidemia.  Will review a lipid profile  Menopausal syndrome   Continue home blood pressure monitoring Continue low-salt diet  Rogelia Boga

## 2017-01-17 ENCOUNTER — Ambulatory Visit: Payer: PPO | Admitting: Internal Medicine

## 2017-01-17 ENCOUNTER — Encounter: Payer: Self-pay | Admitting: Internal Medicine

## 2017-03-24 ENCOUNTER — Ambulatory Visit (INDEPENDENT_AMBULATORY_CARE_PROVIDER_SITE_OTHER): Payer: PPO

## 2017-03-24 DIAGNOSIS — Z23 Encounter for immunization: Secondary | ICD-10-CM

## 2017-04-04 DIAGNOSIS — H25013 Cortical age-related cataract, bilateral: Secondary | ICD-10-CM | POA: Diagnosis not present

## 2017-04-04 DIAGNOSIS — H11153 Pinguecula, bilateral: Secondary | ICD-10-CM | POA: Diagnosis not present

## 2017-04-04 DIAGNOSIS — H524 Presbyopia: Secondary | ICD-10-CM | POA: Diagnosis not present

## 2017-04-04 DIAGNOSIS — H5203 Hypermetropia, bilateral: Secondary | ICD-10-CM | POA: Diagnosis not present

## 2017-04-04 DIAGNOSIS — H52223 Regular astigmatism, bilateral: Secondary | ICD-10-CM | POA: Diagnosis not present

## 2017-04-04 DIAGNOSIS — H18413 Arcus senilis, bilateral: Secondary | ICD-10-CM | POA: Diagnosis not present

## 2017-04-04 DIAGNOSIS — H2513 Age-related nuclear cataract, bilateral: Secondary | ICD-10-CM | POA: Diagnosis not present

## 2017-07-24 ENCOUNTER — Other Ambulatory Visit: Payer: Self-pay | Admitting: Internal Medicine

## 2017-07-24 DIAGNOSIS — Z1231 Encounter for screening mammogram for malignant neoplasm of breast: Secondary | ICD-10-CM

## 2017-08-16 ENCOUNTER — Other Ambulatory Visit: Payer: Self-pay

## 2017-08-16 ENCOUNTER — Telehealth: Payer: Self-pay | Admitting: Internal Medicine

## 2017-08-16 DIAGNOSIS — M810 Age-related osteoporosis without current pathological fracture: Secondary | ICD-10-CM

## 2017-08-16 NOTE — Telephone Encounter (Signed)
Copied from [facility]. Topic: Quick Communication - See Telephone Encounter >> Aug 16, 2017 11:27 AM Rutherford Nail, NT wrote: CRM for notification. See Telephone encounter for: 08/16/17. Patient calling stated that she has mammogram schedule for 4/22. She was wanting the bone density scheduled for the same day. She needs the order placed before she can schedule that. Please advise.

## 2017-08-16 NOTE — Telephone Encounter (Signed)
Spoke to patient referral placed. waitng to be scheduled

## 2017-08-17 NOTE — Telephone Encounter (Signed)
[facility] stated that [facility] received the [facility]. No other action needed.

## 2017-08-28 ENCOUNTER — Ambulatory Visit
Admission: RE | Admit: 2017-08-28 | Discharge: 2017-08-28 | Disposition: A | Discharge status: Home / Self Care | Payer: PPO | Source: Ambulatory Visit | Attending: Internal Medicine | Admitting: Internal Medicine

## 2017-08-28 DIAGNOSIS — Z1231 Encounter for screening mammogram for malignant neoplasm of breast: Secondary | ICD-10-CM

## 2017-09-14 ENCOUNTER — Ambulatory Visit
Admission: RE | Admit: 2017-09-14 | Discharge: 2017-09-14 | Disposition: A | Discharge status: Home / Self Care | Payer: PPO | Source: Ambulatory Visit | Attending: Internal Medicine | Admitting: Internal Medicine

## 2017-09-14 DIAGNOSIS — M810 Age-related osteoporosis without current pathological fracture: Secondary | ICD-10-CM

## 2017-09-14 DIAGNOSIS — Z780 Asymptomatic menopausal state: Secondary | ICD-10-CM | POA: Diagnosis not present

## 2017-09-19 ENCOUNTER — Telehealth: Payer: Self-pay | Admitting: Internal Medicine

## 2017-09-19 NOTE — Telephone Encounter (Signed)
Copied from [facility] [id number]. Topic: Quick Communication - Lab Results >> Sep 18, 2017  5:27 PM Franco Collet, CMA wrote: Called patient to inform them of  lab results. When patient returns call, triage nurse may disclose results.

## 2017-12-11 ENCOUNTER — Encounter: Payer: Self-pay | Admitting: Internal Medicine

## 2017-12-11 ENCOUNTER — Ambulatory Visit (INDEPENDENT_AMBULATORY_CARE_PROVIDER_SITE_OTHER): Payer: PPO | Admitting: Internal Medicine

## 2017-12-11 VITALS — BP 110/62 | HR 70 | Temp 98.3°F | Ht 66.5 in | Wt 186.6 lb

## 2017-12-11 DIAGNOSIS — R7302 Impaired glucose tolerance (oral): Secondary | ICD-10-CM | POA: Diagnosis not present

## 2017-12-11 DIAGNOSIS — Z23 Encounter for immunization: Secondary | ICD-10-CM

## 2017-12-11 DIAGNOSIS — I10 Essential (primary) hypertension: Secondary | ICD-10-CM

## 2017-12-11 DIAGNOSIS — N952 Postmenopausal atrophic vaginitis: Secondary | ICD-10-CM

## 2017-12-11 DIAGNOSIS — Z0000 Encounter for general adult medical examination without abnormal findings: Secondary | ICD-10-CM

## 2017-12-11 DIAGNOSIS — E785 Hyperlipidemia, unspecified: Secondary | ICD-10-CM

## 2017-12-11 LAB — CBC WITH DIFFERENTIAL/PLATELET
BASOS PCT: 0.8 % (ref 0.0–3.0)
Basophils Absolute: 0 10*3/uL (ref 0.0–0.1)
Eosinophils Absolute: 0.1 10*3/uL (ref 0.0–0.7)
Eosinophils Relative: 1.7 % (ref 0.0–5.0)
HEMATOCRIT: 39.1 % (ref 36.0–46.0)
HEMOGLOBIN: 13.4 g/dL (ref 12.0–15.0)
Lymphocytes Relative: 43.1 % (ref 12.0–46.0)
Lymphs Abs: 2.4 10*3/uL (ref 0.7–4.0)
MCHC: 34.2 g/dL (ref 30.0–36.0)
MCV: 89.6 fl (ref 78.0–100.0)
MONO ABS: 0.4 10*3/uL (ref 0.1–1.0)
MONOS PCT: 6.8 % (ref 3.0–12.0)
Neutro Abs: 2.6 10*3/uL (ref 1.4–7.7)
Neutrophils Relative %: 47.6 % (ref 43.0–77.0)
Platelets: 327 10*3/uL (ref 150.0–400.0)
RBC: 4.36 Mil/uL (ref 3.87–5.11)
RDW: 14.4 % (ref 11.5–15.5)
WBC: 5.5 10*3/uL (ref 4.0–10.5)

## 2017-12-11 LAB — LIPID PANEL
CHOL/HDL RATIO: 7
Cholesterol: 233 mg/dL — ABNORMAL HIGH (ref 0–200)
HDL: 35.1 mg/dL — AB (ref >39.00)
LDL Cholesterol: 160 mg/dL — ABNORMAL HIGH (ref 0–99)
NONHDL: 198.04
Triglycerides: 188 mg/dL — ABNORMAL HIGH (ref 0.0–149.0)
VLDL: 37.6 mg/dL (ref 0.0–40.0)

## 2017-12-11 LAB — COMPREHENSIVE METABOLIC PANEL
ALT: 11 U/L (ref 0–35)
AST: 8 U/L (ref 0–37)
Albumin: 4.7 g/dL (ref 3.5–5.2)
Alkaline Phosphatase: 81 U/L (ref 39–117)
BUN: 16 mg/dL (ref 6–23)
CALCIUM: 10.1 mg/dL (ref 8.4–10.5)
CO2: 34 meq/L — AB (ref 19–32)
CREATININE: 0.66 mg/dL (ref 0.40–1.20)
Chloride: 100 mEq/L (ref 96–112)
GFR: 115.04 mL/min (ref >60.00)
Glucose, Bld: 120 mg/dL — ABNORMAL HIGH (ref 70–99)
POTASSIUM: 4.2 meq/L (ref 3.5–5.1)
SODIUM: 141 meq/L (ref 135–145)
Total Bilirubin: 0.6 mg/dL (ref 0.2–1.2)
Total Protein: 7.3 g/dL (ref 6.0–8.3)

## 2017-12-11 LAB — TSH: TSH: 0.23 u[IU]/mL — AB (ref 0.35–4.50)

## 2017-12-11 LAB — HEMOGLOBIN A1C: Hgb A1c MFr Bld: 6.5 % (ref 4.6–6.5)

## 2017-12-11 MED ORDER — ESTRADIOL 0.1 MG/GM VA CREA: 1.0000 | TOPICAL_CREAM | VAGINAL | 6 refills | Status: DC

## 2017-12-11 MED ORDER — HYDROCHLOROTHIAZIDE 25 MG PO TABS: 25.0000 mg | ORAL_TABLET | Freq: Every day | ORAL | 6 refills | Status: DC

## 2017-12-11 NOTE — Progress Notes (Signed)
Subjective:    Patient ID: Danielle Morse, female    DOB: June 14, 1950, 67 y.o.   MRN: [id number]  HPI  67 year old patient who is seen today for a annual follow-up as well as a subsequent Medicare wellness visit.  She does remarkably well.  She remains on hydrochlorothiazide for hypertension.  No concerns or complaints today  She has had a recent bone density study that was normal.  She is up-to-date on screening colonoscopies and mammograms. She does have a history of atrophic vaginitis treated with topical estrogen.  She has a history of impaired glucose tolerance  Family history.  Father died of complications of what sounds like polycythemia and valvular heart disease.  Mother died at 89 of senile dementia.  One sister with lung cancer who was a smoker.  Another sister died at age 104 of apparent lung cancer.  Otherwise no family history of cancer    Past Medical History:  Diagnosis Date  . Anxiety   . Depression   . DVT (deep venous thrombosis) (H[facility])    History over 30 yrs ago, no further problems  . HYPERLIPIDEMIA 06/05/2008  . MENOPAUSAL SYNDROME 06/05/2008     Social History   Socioeconomic History  . Marital status: Single    Spouse name: Not on file  . Number of children: Not on file  . Years of education: Not on file  . Highest education level: Not on file  Occupational History  . Not on file  Social Needs  . Financial resource strain: Not on file  . Food insecurity:    Worry: Not on file    Inability: Not on file  . Transportation needs:    Medical: Not on file    Non-medical: Not on file  Tobacco Use  . Smoking status: Never Smoker  . Smokeless tobacco: Never Used  Substance and Sexual Activity  . Alcohol use: No    Alcohol/week: 0.0 oz  . Drug use: No  . Sexual activity: Not on file  Lifestyle  . Physical activity:    Days per week: Not on file    Minutes per session: Not on file  . Stress: Not on file  Relationships  . Social connections:   Talks on phone: Not on file    Gets together: Not on file    Attends religious service: Not on file    Active member of club or organization: Not on file    Attends meetings of clubs or organizations: Not on file    Relationship status: Not on file  . Intimate partner violence:    Fear of current or ex partner: Not on file    Emotionally abused: Not on file    Physically abused: Not on file    Forced sexual activity: Not on file  Other Topics Concern  . Not on file  Social History Narrative  . Not on file    Past Surgical History:  Procedure Laterality Date  . ABDOMINAL HYSTERECTOMY    . CESAREAN SECTION     x 2  . COLONOSCOPY  2005  . CYSTECTOMY     left elbow  . LAPAROSCOPIC OVARIAN CYSTECTOMY      Family History  Problem Relation Age of Onset  . Heart disease Father   . Cancer Sister   . Colon cancer Neg Hx     No Known Allergies  Current Outpatient Medications on File Prior to Visit  Medication Sig Dispense Refill  . cholecalciferol (VITAMIN D) 1000 UNITS  tablet Take 1,000 Units by mouth daily.    Marland Kitchen estradiol (ESTRACE) 0.1 MG/GM vaginal cream Place 1 Applicatorful vaginally 3 (three) times a week. 42.5 g 2  . hydrochlorothiazide (HYDRODIURIL) 25 MG tablet Take 1 tablet (25 mg total) by mouth daily. 90 tablet 4  . Multiple Vitamin (MULTIVITAMIN) tablet Take 1 tablet by mouth daily.     No current facility-administered medications on file prior to visit.     BP 110/62 (BP Location: Right Arm, Patient Position: Sitting, Cuff Size: Normal)   Pulse 70   Temp 98.3 F (36.8 C) (Oral)   Ht 5' 6.5" (1.689 m)   Wt 186 lb 9.6 oz (84.6 kg)   SpO2 98%   BMI 29.67 kg/m   Subsequent Medicare wellness visit  1. Risk factors, based on past  M,S,F history.  Cardiovascular risk factors include a history of essential hypertension only  2.  Physical activities: Patient has been retired for approximately 2 years.  Walks frequently and does some home exercises as well  3.   Depression/mood: No history major depression or mood disorder  4.  Hearing: No deficits  5.  ADL's: Independent in all aspects of daily living  6.  Fall risk: Low.  No falls over the past year  7.  Home safety: No issues identified  8.  Height weight, and visual acuity; height and weight stable no change in visual acuity.  She was told by ophthalmology that she has significant cataracts.  She states that her vision does well with glasses  9.  Counseling: Continue regular exercise program.  Home blood pressure monitoring encouraged  10. Lab orders based on risk factors: Laboratory update will be reviewed.  Will check hemoglobin A1c due to history of impaired glucose tolerance  11. Referral : None appropriate at this time  12. Care plan: Continue efforts at aggressive risk factor modification  13. Cognitive assessment: Alert and appropriate normal affect.  No cognitive dysfunction  14. Screening: Patient provided with a written and personalized 5-10 year screening schedule in the AVS.    15. Provider List Update: Ophthalmology primary care as well as radiology    Review of Systems  Constitutional: Negative.   HENT: Negative for congestion, dental problem, hearing loss, rhinorrhea, sinus pressure, sore throat and tinnitus.   Eyes: Negative for pain, discharge and visual disturbance.  Respiratory: Negative for cough and shortness of breath.   Cardiovascular: Negative for chest pain, palpitations and leg swelling.  Gastrointestinal: Negative for abdominal distention, abdominal pain, blood in stool, constipation, diarrhea, nausea and vomiting.  Genitourinary: Negative for difficulty urinating, dysuria, flank pain, frequency, hematuria, pelvic pain, urgency, vaginal bleeding, vaginal discharge and vaginal pain.  Musculoskeletal: Negative for arthralgias, gait problem and joint swelling.  Skin: Negative for rash.  Neurological: Negative for dizziness, syncope, speech difficulty,  weakness, numbness and headaches.  Hematological: Negative for adenopathy.  Psychiatric/Behavioral: Negative for agitation, behavioral problems and dysphoric mood. The patient is not nervous/anxious.        Objective:   Physical Exam  Constitutional: She is oriented to person, place, and time. She appears well-developed and well-nourished.  HENT:  Head: Normocephalic and atraumatic.  Right Ear: External ear normal.  Left Ear: External ear normal.  Mouth/Throat: Oropharynx is clear and moist.  Eyes: Conjunctivae and EOM are normal.  Neck: Normal range of motion. Neck supple. No JVD present. No thyromegaly present.  Cardiovascular: Normal rate, regular rhythm, normal heart sounds and intact distal pulses.  No murmur heard. Pulmonary/Chest:  Effort normal and breath sounds normal. She has no wheezes. She has no rales.  Abdominal: Soft. Bowel sounds are normal. She exhibits no distension and no mass. There is no tenderness. There is no rebound and no guarding.  Genitourinary: Vagina normal.  Musculoskeletal: Normal range of motion. She exhibits no edema or tenderness.  Neurological: She is alert and oriented to person, place, and time. She has normal reflexes. She displays normal reflexes. No cranial nerve deficit. She exhibits normal muscle tone. Coordination normal.  Skin: Skin is warm and dry. No rash noted.  Psychiatric: She has a normal mood and affect. Her behavior is normal.          Assessment & Plan:   Essential hypertension well-controlled Subsequent Medicare wellness visit Impaired glucose tolerance.  Will review hemoglobin A1c  No change in medical regimen Check updated lab  Follow-up in 1 year or as needed  [employer]

## 2017-12-11 NOTE — Patient Instructions (Signed)
Limit your sodium (Salt) intake    It is important that you exercise regularly, at least 20 minutes 3 to 4 times per week.  If you develop chest pain or shortness of breath seek  medical attention.  Please check your blood pressure on a regular basis.  If it is consistently greater than 140/90, please make an office appointment.  Return in one year for follow-up

## 2017-12-12 LAB — HEPATITIS C ANTIBODY
HEP C AB: NONREACTIVE
SIGNAL TO CUT-OFF: 0.01 (ref <1.00)

## 2018-03-15 ENCOUNTER — Ambulatory Visit (INDEPENDENT_AMBULATORY_CARE_PROVIDER_SITE_OTHER): Payer: PPO | Admitting: *Deleted

## 2018-03-15 DIAGNOSIS — Z23 Encounter for immunization: Secondary | ICD-10-CM | POA: Diagnosis not present

## 2018-04-03 DIAGNOSIS — H11153 Pinguecula, bilateral: Secondary | ICD-10-CM | POA: Diagnosis not present

## 2018-04-03 DIAGNOSIS — H18413 Arcus senilis, bilateral: Secondary | ICD-10-CM | POA: Diagnosis not present

## 2018-04-03 DIAGNOSIS — H524 Presbyopia: Secondary | ICD-10-CM | POA: Diagnosis not present

## 2018-04-03 DIAGNOSIS — H5203 Hypermetropia, bilateral: Secondary | ICD-10-CM | POA: Diagnosis not present

## 2018-04-03 DIAGNOSIS — H1045 Other chronic allergic conjunctivitis: Secondary | ICD-10-CM | POA: Diagnosis not present

## 2018-04-03 DIAGNOSIS — H2513 Age-related nuclear cataract, bilateral: Secondary | ICD-10-CM | POA: Diagnosis not present

## 2018-04-03 DIAGNOSIS — H52223 Regular astigmatism, bilateral: Secondary | ICD-10-CM | POA: Diagnosis not present

## 2018-04-03 DIAGNOSIS — H25013 Cortical age-related cataract, bilateral: Secondary | ICD-10-CM | POA: Diagnosis not present

## 2018-04-19 ENCOUNTER — Ambulatory Visit (INDEPENDENT_AMBULATORY_CARE_PROVIDER_SITE_OTHER): Payer: PPO | Admitting: Internal Medicine

## 2018-04-19 ENCOUNTER — Encounter: Payer: Self-pay | Admitting: Internal Medicine

## 2018-04-19 VITALS — BP 130/80 | HR 80 | Temp 98.1°F | Wt 189.8 lb

## 2018-04-19 DIAGNOSIS — E785 Hyperlipidemia, unspecified: Secondary | ICD-10-CM | POA: Diagnosis not present

## 2018-04-19 DIAGNOSIS — R7302 Impaired glucose tolerance (oral): Secondary | ICD-10-CM

## 2018-04-19 DIAGNOSIS — I10 Essential (primary) hypertension: Secondary | ICD-10-CM | POA: Diagnosis not present

## 2018-04-19 DIAGNOSIS — H259 Unspecified age-related cataract: Secondary | ICD-10-CM | POA: Diagnosis not present

## 2018-04-19 DIAGNOSIS — H269 Unspecified cataract: Secondary | ICD-10-CM | POA: Insufficient documentation

## 2018-04-19 NOTE — Progress Notes (Signed)
Established Patient Office Visit     CC/Reason for Visit: Establish care, follow-up on chronic medical conditions  HPI: Danielle Morse is a 67 y.o. female who is coming in today for the above mentioned reasons.  Due for annual wellness visit in August 2020. Past Medical History is significant for: Hypertension that has been well controlled on hydrochlorothiazide, impaired glucose tolerance with a most recent A1c of 6.5, hyperlipidemia not on medications.  She saw her eye doctor recently, Dr. Harrison Mons, who diagnosed her with cataracts and she has an appointment later this month with the cataract specialist to discuss surgery. She has no acute complaints today.   Past Medical/Surgical History: Past Medical History:  Diagnosis Date  . Anxiety   . Depression   . DVT (deep venous thrombosis) ([place])    History over 30 yrs ago, no further problems  . HYPERLIPIDEMIA 06/05/2008  . MENOPAUSAL SYNDROME 06/05/2008    Past Surgical History:  Procedure Laterality Date  . ABDOMINAL HYSTERECTOMY    . CESAREAN SECTION     x 2  . COLONOSCOPY  2005  . CYSTECTOMY     left elbow  . LAPAROSCOPIC OVARIAN CYSTECTOMY      Social History:  reports that she has never smoked. She has never used smokeless tobacco. She reports that she does not drink alcohol or use drugs.  Allergies: No Known Allergies  Family History:  Family History  Problem Relation Age of Onset  . Heart disease Father   . Cancer Sister   . Colon cancer Neg Hx      Current Outpatient Medications:  .  cholecalciferol (VITAMIN D) 1000 UNITS tablet, Take 1,000 Units by mouth daily., Disp: , Rfl:  .  hydrochlorothiazide (HYDRODIURIL) 25 MG tablet, Take 1 tablet (25 mg total) by mouth daily., Disp: 90 tablet, Rfl: 6 .  Multiple Vitamin (MULTIVITAMIN) tablet, Take 1 tablet by mouth daily., Disp: , Rfl:   Review of Systems: Constitutional: Denies fever, chills, diaphoresis, appetite change and fatigue.  HEENT: Denies  photophobia, eye pain, redness, hearing loss, ear pain, congestion, sore throat, rhinorrhea, sneezing, mouth sores, trouble swallowing, neck pain, neck stiffness and tinnitus.   Respiratory: Denies SOB, DOE, cough, chest tightness,  and wheezing.   Cardiovascular: Denies chest pain, palpitations and leg swelling.  Gastrointestinal: Denies nausea, vomiting, abdominal pain, diarrhea, constipation, blood in stool and abdominal distention.  Genitourinary: Denies dysuria, urgency, frequency, hematuria, flank pain and difficulty urinating.  Endocrine: Denies: hot or cold intolerance, sweats, changes in hair or nails, polyuria, polydipsia. Musculoskeletal: Denies myalgias, back pain, joint swelling, arthralgias and gait problem.  Skin: Denies pallor, rash and wound.  Neurological: Denies dizziness, seizures, syncope, weakness, light-headedness, numbness and headaches.  Hematological: Denies adenopathy. Easy bruising, personal or family bleeding history  Psychiatric/Behavioral: Denies suicidal ideation, mood changes, confusion, nervousness, sleep disturbance and agitation    Physical Exam: Vitals:   04/19/18 1052  BP: 130/80  Pulse: 80  Temp: 98.1 F (36.7 C)  TempSrc: Oral  SpO2: 98%  Weight: 189 lb 12.8 oz (86.1 kg)    Body mass index is 30.18 kg/m.   Constitutional: NAD, calm, comfortable Eyes: PERRL, lids and conjunctivae normal ENMT: Mucous membranes are moist.  Neck: normal, supple, no masses, no thyromegaly Respiratory: clear to auscultation bilaterally, no wheezing, no crackles. Normal respiratory effort. No accessory muscle use.  Cardiovascular: Regular rate and rhythm, no murmurs / rubs / gallops. No extremity edema. 2+ pedal pulses. No carotid bruits.  Abdomen: no tenderness,  no masses palpated. No hepatosplenomegaly. Bowel sounds positive.  Musculoskeletal: no clubbing / cyanosis. No joint deformity upper and lower extremities. Good ROM, no contractures. Normal muscle tone.    Skin: no rashes, lesions, ulcers. No induration Neurologic: grossly intact and non-focal Psychiatric: Normal judgment and insight. Alert and oriented x 3. Normal mood.    Impression and Plan:  Dyslipidemia -LDL 160 in 8/19. -Working on lifestyle modifications. -With her h/o HTN and IGT, if still elevated at next wellness check will need to start on statins.  Essential hypertension -Well controlled on HCTZ.  Impaired glucose tolerance  Lab Results  Component Value Date   HGBA1C 6.5 12/11/2017   -Recheck A1C in 6 months. -Lifestyle modifications have been discussed with her today.   Age-related cataract of both eyes, unspecified age-related cataract type -Appointment on 12/31 with cataract specialist to discuss necessity of surgery.    Patient Instructions  -It was nice seeing you today!  -We will see you back in August 2020 for your annual physical.  Please come fasting as we will do blood work at that time.     [name], MD [place] [facility]

## 2018-04-19 NOTE — Patient Instructions (Signed)
-  It was nice seeing you today!  -We will see you back in August 2020 for your annual physical.  Please come fasting as we will do blood work at that time.

## 2018-05-08 DIAGNOSIS — H2513 Age-related nuclear cataract, bilateral: Secondary | ICD-10-CM | POA: Diagnosis not present

## 2018-09-06 ENCOUNTER — Other Ambulatory Visit: Payer: Self-pay | Admitting: Internal Medicine

## 2018-09-06 DIAGNOSIS — Z1231 Encounter for screening mammogram for malignant neoplasm of breast: Secondary | ICD-10-CM

## 2018-10-19 ENCOUNTER — Ambulatory Visit: Payer: PPO | Admitting: Internal Medicine

## 2018-11-01 ENCOUNTER — Ambulatory Visit
Admission: RE | Admit: 2018-11-01 | Discharge: 2018-11-01 | Disposition: A | Discharge status: Home / Self Care | Payer: PPO | Source: Ambulatory Visit | Attending: Internal Medicine | Admitting: Internal Medicine

## 2018-11-01 ENCOUNTER — Other Ambulatory Visit: Payer: Self-pay

## 2018-11-01 DIAGNOSIS — Z1231 Encounter for screening mammogram for malignant neoplasm of breast: Secondary | ICD-10-CM

## 2018-11-20 ENCOUNTER — Other Ambulatory Visit: Payer: Self-pay

## 2018-11-20 ENCOUNTER — Emergency Department (HOSPITAL_COMMUNITY)
Admission: EM | Admit: 2018-11-20 | Discharge: 2018-11-20 | Disposition: A | Discharge status: Home / Self Care | Payer: PPO | Attending: Emergency Medicine | Admitting: Emergency Medicine

## 2018-11-20 ENCOUNTER — Emergency Department (HOSPITAL_COMMUNITY): Payer: PPO

## 2018-11-20 DIAGNOSIS — R55 Syncope and collapse: Secondary | ICD-10-CM | POA: Insufficient documentation

## 2018-11-20 DIAGNOSIS — I10 Essential (primary) hypertension: Secondary | ICD-10-CM | POA: Insufficient documentation

## 2018-11-20 DIAGNOSIS — E876 Hypokalemia: Secondary | ICD-10-CM | POA: Diagnosis not present

## 2018-11-20 DIAGNOSIS — Z79899 Other long term (current) drug therapy: Secondary | ICD-10-CM | POA: Diagnosis not present

## 2018-11-20 DIAGNOSIS — R110 Nausea: Secondary | ICD-10-CM | POA: Diagnosis not present

## 2018-11-20 DIAGNOSIS — R531 Weakness: Secondary | ICD-10-CM | POA: Diagnosis not present

## 2018-11-20 DIAGNOSIS — I959 Hypotension, unspecified: Secondary | ICD-10-CM | POA: Diagnosis not present

## 2018-11-20 DIAGNOSIS — R000 Tachycardia, unspecified: Secondary | ICD-10-CM | POA: Diagnosis not present

## 2018-11-20 DIAGNOSIS — R0902 Hypoxemia: Secondary | ICD-10-CM | POA: Diagnosis not present

## 2018-11-20 DIAGNOSIS — R231 Pallor: Secondary | ICD-10-CM | POA: Diagnosis not present

## 2018-11-20 LAB — CBC WITH DIFFERENTIAL/PLATELET
Abs Immature Granulocytes: 0.02 10*3/uL (ref 0.00–0.07)
Basophils Absolute: 0 10*3/uL (ref 0.0–0.1)
Basophils Relative: 0 %
Eosinophils Absolute: 0.1 10*3/uL (ref 0.0–0.5)
Eosinophils Relative: 1 %
HCT: 42.7 % (ref 36.0–46.0)
Hemoglobin: 14.3 g/dL (ref 12.0–15.0)
Immature Granulocytes: 0 %
Lymphocytes Relative: 29 %
Lymphs Abs: 2.4 10*3/uL (ref 0.7–4.0)
MCH: 30.5 pg (ref 26.0–34.0)
MCHC: 33.5 g/dL (ref 30.0–36.0)
MCV: 91 fL (ref 80.0–100.0)
Monocytes Absolute: 0.4 10*3/uL (ref 0.1–1.0)
Monocytes Relative: 5 %
Neutro Abs: 5.5 10*3/uL (ref 1.7–7.7)
Neutrophils Relative %: 65 %
Platelets: 328 10*3/uL (ref 150–400)
RBC: 4.69 MIL/uL (ref 3.87–5.11)
RDW: 14.1 % (ref 11.5–15.5)
WBC: 8.3 10*3/uL (ref 4.0–10.5)
nRBC: 0 % (ref 0.0–0.2)

## 2018-11-20 LAB — COMPREHENSIVE METABOLIC PANEL
ALT: 20 U/L (ref 0–44)
AST: 16 U/L (ref 15–41)
Albumin: 4.4 g/dL (ref 3.5–5.0)
Alkaline Phosphatase: 85 U/L (ref 38–126)
Anion gap: 14 (ref 5–15)
BUN: 11 mg/dL (ref 8–23)
CO2: 24 mmol/L (ref 22–32)
Calcium: 9.6 mg/dL (ref 8.9–10.3)
Chloride: 102 mmol/L (ref 98–111)
Creatinine, Ser: 0.89 mg/dL (ref 0.44–1.00)
GFR calc Af Amer: >60 mL/min (ref >60)
GFR calc non Af Amer: >60 mL/min (ref >60)
Glucose, Bld: 140 mg/dL — ABNORMAL HIGH (ref 70–99)
Potassium: 2.7 mmol/L — CL (ref 3.5–5.1)
Sodium: 140 mmol/L (ref 135–145)
Total Bilirubin: 0.9 mg/dL (ref 0.3–1.2)
Total Protein: 7.3 g/dL (ref 6.5–8.1)

## 2018-11-20 LAB — CK: Total CK: 187 U/L (ref 38–234)

## 2018-11-20 LAB — CBG MONITORING, ED: Glucose-Capillary: 124 mg/dL — ABNORMAL HIGH (ref 70–99)

## 2018-11-20 MED ORDER — SODIUM CHLORIDE 0.9 % IV SOLN
INTRAVENOUS | Status: DC
  Administered 2018-11-20: 16:00:00 via INTRAVENOUS

## 2018-11-20 MED ORDER — POTASSIUM CHLORIDE CRYS ER 20 MEQ PO TBCR
40.0000 meq | EXTENDED_RELEASE_TABLET | Freq: Once | ORAL | Status: AC
  Administered 2018-11-20: 40 meq via ORAL
  Filled 2018-11-20: qty 2

## 2018-11-20 MED ORDER — POTASSIUM CHLORIDE IN NACL 20-0.9 MEQ/L-% IV SOLN
Freq: Once | INTRAVENOUS | Status: AC
  Administered 2018-11-20: 18:00:00 via INTRAVENOUS
  Filled 2018-11-20: qty 1000

## 2018-11-20 NOTE — ED Notes (Signed)
Pt tolerated meal/fluids without difficulty

## 2018-11-20 NOTE — ED Triage Notes (Signed)
Pt BIB [facility] for a near syncopal episode. Per EMS patient was getting ready for an appointment this afternoon when she become very hot and diaphoretic with a near syncopal episode. 150 of NSS given by EMS. Pt is alert and oriented x4 upon ED arrival, denies any sob, chest pain, nausea, vomiting, of discomfort.

## 2018-11-20 NOTE — ED Provider Notes (Signed)
[facility] EMERGENCY DEPARTMENT Provider Note   CSN: [id number] Arrival date & time: 11/20/18  1534    History   Chief Complaint Chief Complaint  Patient presents with  . Near Syncope  . Weakness    HPI Danielle Morse is a 68 y.o. female.     HPI Patient presents after an episode of near syncope. Patient was in her usual state of health, preparing to see her eye doctor today, when she felt suddenly lightheaded, diaphoretic, nauseous. No complete syncope, no fall. On EMS arrival the patient was diaphoretic, but awake, alert, oriented appropriately. Patient received saline, has improved substantially, now states that she is markedly better. She denies pain, weakness in any extremity.  On she states that she has had no recent medication changes, diet changes, activity changes. She does not drink regularly, has had a few beers this week.    Past Medical History:  Diagnosis Date  . Anxiety   . Depression   . DVT (deep venous thrombosis) ([place])    History over 30 yrs ago, no further problems  . HYPERLIPIDEMIA 06/05/2008  . MENOPAUSAL SYNDROME 06/05/2008    Patient Active Problem List   Diagnosis Date Noted  . Cataracts, bilateral 04/19/2018  . Impaired glucose tolerance 12/11/2017  . Atrophic vaginitis 07/21/2016  . De Quervain's tenosynovitis, right 06/10/2016  . Ganglion cyst of dorsum of right wrist 06/10/2016  . [place] arthritis 06/10/2016  . Essential hypertension 09/22/2014  . Dyslipidemia 06/05/2008  . MENOPAUSAL SYNDROME 06/05/2008    Past Surgical History:  Procedure Laterality Date  . ABDOMINAL HYSTERECTOMY    . CESAREAN SECTION     x 2  . COLONOSCOPY  2005  . CYSTECTOMY     left elbow  . LAPAROSCOPIC OVARIAN CYSTECTOMY       OB History   No obstetric history on file.      Home Medications    Prior to Admission medications   Medication Sig Start Date End Date Taking? Authorizing Provider  cholecalciferol (VITAMIN D)  1000 UNITS tablet Take 1,000 Units by mouth daily.    [provider]  hydrochlorothiazide (HYDRODIURIL) 25 MG tablet Take 1 tablet (25 mg total) by mouth daily. 12/11/17   Marletta Lor, MD  Multiple Vitamin (MULTIVITAMIN) tablet Take 1 tablet by mouth daily.    [provider]    Family History Family History  Problem Relation Age of Onset  . Heart disease Father   . Cancer Sister   . Colon cancer Neg Hx     Social History Social History   Tobacco Use  . Smoking status: Never Smoker  . Smokeless tobacco: Never Used  Substance Use Topics  . Alcohol use: No    Alcohol/week: 0.0 standard drinks  . Drug use: No     Allergies   Patient has no known allergies.   Review of Systems Review of Systems  Constitutional:       Per HPI, otherwise negative  HENT:       Per HPI, otherwise negative  Respiratory:       Per HPI, otherwise negative  Cardiovascular:       Per HPI, otherwise negative  Gastrointestinal: Positive for nausea. Negative for vomiting.  Endocrine:       Negative aside from HPI  Genitourinary:       Neg aside from HPI   Musculoskeletal:       Per HPI, otherwise negative  Skin: Negative.   Neurological: Positive for  light-headedness. Negative for syncope.     Physical Exam Updated Vital Signs BP (!) 141/86 (BP Location: Left Arm)   Pulse 82   Temp 98.3 F (36.8 C) (Oral)   Resp 15   Ht 5\' 6"  (1.676 m)   Wt 86.2 kg   SpO2 100%   BMI 30.67 kg/m   Physical Exam Vitals signs and nursing note reviewed.  Constitutional:      General: She is not in acute distress.    Appearance: She is well-developed.  HENT:     Head: Normocephalic and atraumatic.  Eyes:     Conjunctiva/sclera: Conjunctivae normal.  Cardiovascular:     Rate and Rhythm: Normal rate and regular rhythm.  Pulmonary:     Effort: Pulmonary effort is normal. No respiratory distress.     Breath sounds: Normal breath sounds. No stridor.  Abdominal:      General: There is no distension.  Skin:    General: Skin is warm and dry.  Neurological:     Mental Status: She is alert and oriented to person, place, and time.     Cranial Nerves: No cranial nerve deficit.      ED Treatments / Results  Labs (all labs ordered are listed, but only abnormal results are displayed) Labs Reviewed  COMPREHENSIVE METABOLIC PANEL - Abnormal; Notable for the following components:      Result Value   Potassium 2.7 (*)    Glucose, Bld 140 (*)    All other components within normal limits  CBG MONITORING, ED - Abnormal; Notable for the following components:   Glucose-Capillary 124 (*)    All other components within normal limits  CBC WITH DIFFERENTIAL/PLATELET  CK    EKG EKG Interpretation  Date/Time:  Tuesday November 20 2018 15:37:19 EDT Ventricular Rate:  69 PR Interval:    QRS Duration: 98 QT Interval:  433 QTC Calculation: 464 R Axis:   46 Text Interpretation:  Sinus rhythm Probable left atrial enlargement unremarkable ecg Confirmed by Carmin Muskrat [phone]) on 11/20/2018 4:02:54 PM   Radiology Dg Chest Port 1 View  Result Date: 11/20/2018 CLINICAL DATA:  Syncopal episode. EXAM: PORTABLE CHEST 1 VIEW COMPARISON:  None. FINDINGS: No consolidation, features of edema, pneumothorax, or effusion. Mild atheromatous plaque in the aorta. Cardiomediastinal contours are unremarkable. Degenerative changes in the spine and shoulders. Cardiac monitoring leads overlie the chest. No acute osseous or soft tissue abnormality. IMPRESSION: No acute cardiopulmonary abnormality Electronically Signed   By: Lovena Le M.D.   On: 11/20/2018 16:06    Procedures Procedures (including critical care time)  Medications Ordered in ED Medications  0.9 %  sodium chloride infusion ( Intravenous New Bag/Given 11/20/18 1550)  0.9 % NaCl with KCl 20 mEq/ L  infusion ( Intravenous New Bag/Given 11/20/18 1810)  potassium chloride SA (K-DUR) CR tablet 40 mEq (40 mEq Oral Given  11/20/18 1810)     Initial Impression / Assessment and Plan / ED Course  I have reviewed the triage vital signs and the nursing notes.  Pertinent labs & imaging results that were available during my care of the patient were reviewed by me and considered in my medical decision making (see chart for details).  Update: Initial labs notable for hypokalemia. Patient is intolerant of oral, but given the substantial abnormality, patient will receive both fluid resuscitation and oral loading. Update:, Patient in no distress, tolerating fluids.  8:22 PM Patient in no distress, awake, alert, has been eating, drinking without complication. No ongoing complaints.  This patient presents after an episode of lightheadedness, possible near syncope. Patient had no pain either before or afterwards is little evidence for sustained arrhythmia, no evidence for ACS. Patient does have evidence for dehydration, hypokalemia, requiring IV and oral repletion. This was well-tolerated, there are some suspicion for dehydration contributing to her presentation. Patient will follow-up within 1 week with physician for repeat evaluation.  Final Clinical Impressions(s) / ED Diagnoses   Final diagnoses:  Near syncope  Hypokalemia     Carmin Muskrat, MD 11/20/18 2023

## 2018-11-20 NOTE — Discharge Instructions (Addendum)
As discussed, your evaluation today has been largely reassuring.  But, it is important that you monitor your condition carefully, and do not hesitate to return to the ED if you develop new, or concerning changes in your condition.  Otherwise, please follow-up with your physician for appropriate ongoing care.  Please be sure to have your potassium level evaluated in 1 week.

## 2018-11-29 ENCOUNTER — Encounter: Payer: Self-pay | Admitting: Internal Medicine

## 2018-11-29 ENCOUNTER — Ambulatory Visit (INDEPENDENT_AMBULATORY_CARE_PROVIDER_SITE_OTHER): Payer: PPO | Admitting: Internal Medicine

## 2018-11-29 VITALS — BP 120/70 | HR 96 | Temp 98.7°F | Wt 191.6 lb

## 2018-11-29 DIAGNOSIS — E876 Hypokalemia: Secondary | ICD-10-CM

## 2018-11-29 DIAGNOSIS — R55 Syncope and collapse: Secondary | ICD-10-CM | POA: Diagnosis not present

## 2018-11-29 LAB — BASIC METABOLIC PANEL
BUN: 10 mg/dL (ref 6–23)
CO2: 31 mEq/L (ref 19–32)
Calcium: 9.9 mg/dL (ref 8.4–10.5)
Chloride: 99 mEq/L (ref 96–112)
Creatinine, Ser: 0.69 mg/dL (ref 0.40–1.20)
GFR: 102.52 mL/min (ref >60.00)
Glucose, Bld: 94 mg/dL (ref 70–99)
Potassium: 4 mEq/L (ref 3.5–5.1)
Sodium: 140 mEq/L (ref 135–145)

## 2018-11-29 NOTE — Patient Instructions (Signed)
-  Nice seeing your today!!  -Lab work today; will notify you once results are available.  -Keep your appointment next month for your physical.

## 2018-11-29 NOTE — Progress Notes (Signed)
Established Patient Office Visit     CC/Reason for Visit: ED follow-up  HPI: Danielle Morse is a 68 y.o. female who is coming in today for the above mentioned reasons. Past Medical History is significant for: Well-controlled hypertension, impaired glucose tolerance and hyperlipidemia not on medications.  On July 14 she felt very dizzy and lightheaded like she was going to pass out, called 911, she was brought to the hospital.  I have reviewed ED records in detail.  It appears she was diagnosed with dehydration and hypokalemia with a potassium of 2.7.  She was given IV and oral potassium replacement and asked to follow-up with me today. Nothing more serious was discovered.  She has no acute complaints today and feels completely resolved from this issue.  She does mention that she is having cataract surgery at the end of the month.   Past Medical/Surgical History: Past Medical History:  Diagnosis Date  . Anxiety   . Depression   . DVT (deep venous thrombosis) ([place])    History over 30 yrs ago, no further problems  . HYPERLIPIDEMIA 06/05/2008  . MENOPAUSAL SYNDROME 06/05/2008    Past Surgical History:  Procedure Laterality Date  . ABDOMINAL HYSTERECTOMY    . CESAREAN SECTION     x 2  . COLONOSCOPY  2005  . CYSTECTOMY     left elbow  . LAPAROSCOPIC OVARIAN CYSTECTOMY      Social History:  reports that she has never smoked. She has never used smokeless tobacco. She reports that she does not drink alcohol or use drugs.  Allergies: No Known Allergies  Family History:  Family History  Problem Relation Age of Onset  . Heart disease Father   . Cancer Sister   . Colon cancer Neg Hx      Current Outpatient Medications:  .  cholecalciferol (VITAMIN D) 1000 UNITS tablet, Take 1,000 Units by mouth daily., Disp: , Rfl:  .  hydrochlorothiazide (HYDRODIURIL) 25 MG tablet, Take 1 tablet (25 mg total) by mouth daily., Disp: 90 tablet, Rfl: 6 .  Multiple Vitamin  (MULTIVITAMIN) tablet, Take 1 tablet by mouth daily., Disp: , Rfl:   Review of Systems:  Constitutional: Denies fever, chills, diaphoresis, appetite change and fatigue.  HEENT: Denies photophobia, eye pain, redness, hearing loss, ear pain, congestion, sore throat, rhinorrhea, sneezing, mouth sores, trouble swallowing, neck pain, neck stiffness and tinnitus.   Respiratory: Denies SOB, DOE, cough, chest tightness,  and wheezing.   Cardiovascular: Denies chest pain, palpitations and leg swelling.  Gastrointestinal: Denies nausea, vomiting, abdominal pain, diarrhea, constipation, blood in stool and abdominal distention.  Genitourinary: Denies dysuria, urgency, frequency, hematuria, flank pain and difficulty urinating.  Endocrine: Denies: hot or cold intolerance, sweats, changes in hair or nails, polyuria, polydipsia. Musculoskeletal: Denies myalgias, back pain, joint swelling, arthralgias and gait problem.  Skin: Denies pallor, rash and wound.  Neurological: Denies dizziness, seizures, syncope, weakness, light-headedness, numbness and headaches.  Hematological: Denies adenopathy. Easy bruising, personal or family bleeding history  Psychiatric/Behavioral: Denies suicidal ideation, mood changes, confusion, nervousness, sleep disturbance and agitation    Physical Exam: Vitals:   11/29/18 1419  BP: 120/70  Pulse: 96  Temp: 98.7 F (37.1 C)  TempSrc: Oral  SpO2: 97%  Weight: 191 lb 9.6 oz (86.9 kg)    Body mass index is 30.93 kg/m.   Constitutional: NAD, calm, comfortable Eyes: PERRL, lids and conjunctivae normal, wears corrective lenses ENMT: Mucous membranes are moist.  Respiratory: clear to auscultation bilaterally,  no wheezing, no crackles. Normal respiratory effort. No accessory muscle use.  Cardiovascular: Regular rate and rhythm, no murmurs / rubs / gallops. No extremity edema. 2+ pedal pulses. No carotid bruits.   Psychiatric: Normal judgment and insight. Alert and oriented x  3. Normal mood.    Impression and Plan:  Near syncope Hypokalemia   -BMET today to recheck potassium. -Seems to have completely recovered.  Follow up next month as scheduled for CPE.    Patient Instructions  -Nice seeing your today!!  -Lab work today; will notify you once results are available.  -Keep your appointment next month for your physical.     [name], MD [facility]

## 2018-11-30 ENCOUNTER — Telehealth: Payer: Self-pay | Admitting: Internal Medicine

## 2018-11-30 ENCOUNTER — Emergency Department (HOSPITAL_COMMUNITY)
Admission: EM | Admit: 2018-11-30 | Discharge: 2018-11-30 | Disposition: A | Discharge status: Home / Self Care | Payer: PPO | Attending: Emergency Medicine | Admitting: Emergency Medicine

## 2018-11-30 ENCOUNTER — Encounter (HOSPITAL_COMMUNITY): Payer: Self-pay | Admitting: Emergency Medicine

## 2018-11-30 DIAGNOSIS — E876 Hypokalemia: Secondary | ICD-10-CM | POA: Insufficient documentation

## 2018-11-30 DIAGNOSIS — Z79899 Other long term (current) drug therapy: Secondary | ICD-10-CM | POA: Diagnosis not present

## 2018-11-30 DIAGNOSIS — L509 Urticaria, unspecified: Secondary | ICD-10-CM | POA: Diagnosis not present

## 2018-11-30 DIAGNOSIS — T7840XA Allergy, unspecified, initial encounter: Secondary | ICD-10-CM | POA: Diagnosis not present

## 2018-11-30 DIAGNOSIS — R202 Paresthesia of skin: Secondary | ICD-10-CM | POA: Diagnosis not present

## 2018-11-30 DIAGNOSIS — H2513 Age-related nuclear cataract, bilateral: Secondary | ICD-10-CM | POA: Diagnosis not present

## 2018-11-30 DIAGNOSIS — R0902 Hypoxemia: Secondary | ICD-10-CM | POA: Diagnosis not present

## 2018-11-30 LAB — CBC WITH DIFFERENTIAL/PLATELET
Abs Immature Granulocytes: 0.01 10*3/uL (ref 0.00–0.07)
Basophils Absolute: 0 10*3/uL (ref 0.0–0.1)
Basophils Relative: 1 %
Eosinophils Absolute: 0.1 10*3/uL (ref 0.0–0.5)
Eosinophils Relative: 1 %
HCT: 38.7 % (ref 36.0–46.0)
Hemoglobin: 13 g/dL (ref 12.0–15.0)
Immature Granulocytes: 0 %
Lymphocytes Relative: 51 %
Lymphs Abs: 2.7 10*3/uL (ref 0.7–4.0)
MCH: 31 pg (ref 26.0–34.0)
MCHC: 33.6 g/dL (ref 30.0–36.0)
MCV: 92.4 fL (ref 80.0–100.0)
Monocytes Absolute: 0.3 10*3/uL (ref 0.1–1.0)
Monocytes Relative: 6 %
Neutro Abs: 2.2 10*3/uL (ref 1.7–7.7)
Neutrophils Relative %: 41 %
Platelets: 341 10*3/uL (ref 150–400)
RBC: 4.19 MIL/uL (ref 3.87–5.11)
RDW: 13.9 % (ref 11.5–15.5)
WBC: 5.3 10*3/uL (ref 4.0–10.5)
nRBC: 0 % (ref 0.0–0.2)

## 2018-11-30 LAB — COMPREHENSIVE METABOLIC PANEL
ALT: 16 U/L (ref 0–44)
AST: 18 U/L (ref 15–41)
Albumin: 4.2 g/dL (ref 3.5–5.0)
Alkaline Phosphatase: 73 U/L (ref 38–126)
Anion gap: 11 (ref 5–15)
BUN: 9 mg/dL (ref 8–23)
CO2: 26 mmol/L (ref 22–32)
Calcium: 9.4 mg/dL (ref 8.9–10.3)
Chloride: 102 mmol/L (ref 98–111)
Creatinine, Ser: 0.72 mg/dL (ref 0.44–1.00)
GFR calc Af Amer: >60 mL/min (ref >60)
GFR calc non Af Amer: >60 mL/min (ref >60)
Glucose, Bld: 136 mg/dL — ABNORMAL HIGH (ref 70–99)
Potassium: 3.3 mmol/L — ABNORMAL LOW (ref 3.5–5.1)
Sodium: 139 mmol/L (ref 135–145)
Total Bilirubin: 0.4 mg/dL (ref 0.3–1.2)
Total Protein: 7 g/dL (ref 6.5–8.1)

## 2018-11-30 LAB — MAGNESIUM: Magnesium: 2.3 mg/dL (ref 1.7–2.4)

## 2018-11-30 MED ORDER — POTASSIUM CHLORIDE CRYS ER 20 MEQ PO TBCR
20.0000 meq | EXTENDED_RELEASE_TABLET | Freq: Once | ORAL | Status: AC
  Administered 2018-11-30: 20 meq via ORAL
  Filled 2018-11-30: qty 1

## 2018-11-30 NOTE — ED Triage Notes (Signed)
Pt to ER by [facility] for evaluation of "sudden onset tingling and feeling flushed" at noon today. EMS reports while in route, noticed a rash to left arm, patient reported itching, so 50 mg oral benadryl was given. On arrival, patient has no visible rash, reports still feels tingling. She is alert and oriented, in NAD.

## 2018-11-30 NOTE — ED Notes (Signed)
Pt voices understanding of discharge instructions. In NAD at departure.

## 2018-11-30 NOTE — Telephone Encounter (Signed)
Patient needs her medication sent in to:   [id number] - [place], [place] [phone] (Phone) [phone] (Fax)     She was also seen in the ED this morning and the ER said that she is low on Potassium and that the Dr needs to call it in for her.  Please Advise

## 2018-11-30 NOTE — Telephone Encounter (Signed)
Please contact husband about med request.  Her potassium was low in the ed todAY

## 2018-11-30 NOTE — Discharge Instructions (Addendum)
You were seen in the emergency department for itching and tingling over your body.  Your symptoms improved here with some Benadryl.  This is possibly allergic reaction and you should continue Benadryl 1 to 2 tablets every 6 hours as needed for the next few days.  Please follow-up with your doctor return if any worsening symptoms.

## 2018-11-30 NOTE — ED Provider Notes (Signed)
[facility] EMERGENCY DEPARTMENT Provider Note   CSN: [id number] Arrival date & time: 11/30/18  1239     History   Chief Complaint Chief Complaint  Patient presents with  . Numbness  . Rash    HPI Danielle Morse is a 68 y.o. female.  She said she was here over a week ago for lightheadedness and found to have low potassium.  She followed up with her doctor yesterday and had repeat labs done but she did not hear any results.  Today she went to the eye doctors where she received some drops because they were evaluating her for possible cataract surgery.  She was phone when she experienced acute onset of tingling and itching all over like she needed to scratch her skin.  She called 911 and EMS noticed may be a hive on her forearm and gave her 50 of Benadryl.  Patient states her symptoms are still there although feel like they are improving.  She denies any other new medications or prior history of this.  No shortness of breath no fever no cough.     The history is provided by the patient.  Allergic Reaction Presenting symptoms: itching and rash (redness diffuse)   Severity:  Severe Prior allergic episodes:  No prior episodes Relieved by:  None tried Worsened by:  Nothing Ineffective treatments:  Antihistamines   Past Medical History:  Diagnosis Date  . Anxiety   . Depression   . DVT (deep venous thrombosis) ([place])    History over 30 yrs ago, no further problems  . HYPERLIPIDEMIA 06/05/2008  . MENOPAUSAL SYNDROME 06/05/2008    Patient Active Problem List   Diagnosis Date Noted  . Cataracts, bilateral 04/19/2018  . Impaired glucose tolerance 12/11/2017  . Atrophic vaginitis 07/21/2016  . De Quervain's tenosynovitis, right 06/10/2016  . Ganglion cyst of dorsum of right wrist 06/10/2016  . [place] arthritis 06/10/2016  . Essential hypertension 09/22/2014  . Dyslipidemia 06/05/2008  . MENOPAUSAL SYNDROME 06/05/2008    Past Surgical History:  Procedure  Laterality Date  . ABDOMINAL HYSTERECTOMY    . CESAREAN SECTION     x 2  . COLONOSCOPY  2005  . CYSTECTOMY     left elbow  . LAPAROSCOPIC OVARIAN CYSTECTOMY       OB History   No obstetric history on file.      Home Medications    Prior to Admission medications   Medication Sig Start Date End Date Taking? Authorizing Provider  cholecalciferol (VITAMIN D) 1000 UNITS tablet Take 1,000 Units by mouth daily.    [provider]  hydrochlorothiazide (HYDRODIURIL) 25 MG tablet Take 1 tablet (25 mg total) by mouth daily. 12/11/17   Marletta Lor, MD  Multiple Vitamin (MULTIVITAMIN) tablet Take 1 tablet by mouth daily.    [provider]    Family History Family History  Problem Relation Age of Onset  . Heart disease Father   . Cancer Sister   . Colon cancer Neg Hx     Social History Social History   Tobacco Use  . Smoking status: Never Smoker  . Smokeless tobacco: Never Used  Substance Use Topics  . Alcohol use: No    Alcohol/week: 0.0 standard drinks  . Drug use: No     Allergies   Patient has no known allergies.   Review of Systems Review of Systems  Constitutional: Negative for fever.  HENT: Negative for sore throat.   Eyes: Negative for pain.  Respiratory:  Negative for shortness of breath.   Cardiovascular: Negative for chest pain.  Gastrointestinal: Negative for abdominal pain.  Genitourinary: Negative for dysuria.  Musculoskeletal: Negative for neck pain.  Skin: Positive for itching and rash (redness diffuse).  Neurological: Negative for headaches.     Physical Exam Updated Vital Signs BP (!) 148/85 (BP Location: Right Arm)   Pulse 87   Temp 97.7 F (36.5 C) (Oral)   Resp 14   SpO2 99%   Physical Exam Vitals signs and nursing note reviewed.  Constitutional:      General: She is not in acute distress.    Appearance: She is well-developed.  HENT:     Head: Normocephalic and atraumatic.  Eyes:     Conjunctiva/sclera:  Conjunctivae normal.  Neck:     Musculoskeletal: Neck supple.  Cardiovascular:     Rate and Rhythm: Normal rate and regular rhythm.     Heart sounds: No murmur.  Pulmonary:     Effort: Pulmonary effort is normal. No respiratory distress.     Breath sounds: Normal breath sounds.  Abdominal:     Palpations: Abdomen is soft.     Tenderness: There is no abdominal tenderness.  Musculoskeletal: Normal range of motion.     Right lower leg: No edema.     Left lower leg: No edema.  Skin:    General: Skin is warm and dry.     Capillary Refill: Capillary refill takes less than 2 seconds.     Findings: No rash.  Neurological:     General: No focal deficit present.     Mental Status: She is alert and oriented to person, place, and time.     Sensory: No sensory deficit.     Motor: No weakness.      ED Treatments / Results  Labs (all labs ordered are listed, but only abnormal results are displayed) Labs Reviewed  COMPREHENSIVE METABOLIC PANEL - Abnormal; Notable for the following components:      Result Value   Potassium 3.3 (*)    Glucose, Bld 136 (*)    All other components within normal limits  CBC WITH DIFFERENTIAL/PLATELET  MAGNESIUM    EKG None  Radiology No results found.  Procedures Procedures (including critical care time)  Medications Ordered in ED Medications  potassium chloride SA (K-DUR) CR tablet 20 mEq (20 mEq Oral Given 11/30/18 1411)     Initial Impression / Assessment and Plan / ED Course  I have reviewed the triage vital signs and the nursing notes.  Pertinent labs & imaging results that were available during my care of the patient were reviewed by me and considered in my medical decision making (see chart for details).  Clinical Course as of Dec 01 843  Fri Nov 30, 2018  1401 Differential diagnosis includes allergic reaction, anxiety, liver failure.    [MB]  1401 After receiving Benadryl prehospital patient states her symptoms are improved.  Her  labs show a potassium slightly low at 3.3 will be orally repleted.  LFTs are normal.   [MB]    Clinical Course User Index [MB] Hayden Rasmussen, MD        Final Clinical Impressions(s) / ED Diagnoses   Final diagnoses:  Paresthesia  Hypokalemia    ED Discharge Orders    None       Hayden Rasmussen, MD 12/01/18 [phone]

## 2018-12-04 NOTE — Telephone Encounter (Signed)
See lab note.  

## 2018-12-06 DIAGNOSIS — H2512 Age-related nuclear cataract, left eye: Secondary | ICD-10-CM | POA: Diagnosis not present

## 2018-12-13 ENCOUNTER — Other Ambulatory Visit: Payer: Self-pay

## 2018-12-13 ENCOUNTER — Other Ambulatory Visit: Payer: Self-pay | Admitting: Internal Medicine

## 2018-12-13 ENCOUNTER — Ambulatory Visit (INDEPENDENT_AMBULATORY_CARE_PROVIDER_SITE_OTHER): Payer: PPO | Admitting: Internal Medicine

## 2018-12-13 ENCOUNTER — Encounter: Payer: Self-pay | Admitting: Internal Medicine

## 2018-12-13 VITALS — BP 120/80 | HR 67 | Temp 98.0°F | Ht 67.0 in | Wt 187.9 lb

## 2018-12-13 DIAGNOSIS — Z23 Encounter for immunization: Secondary | ICD-10-CM

## 2018-12-13 DIAGNOSIS — R7302 Impaired glucose tolerance (oral): Secondary | ICD-10-CM

## 2018-12-13 DIAGNOSIS — E785 Hyperlipidemia, unspecified: Secondary | ICD-10-CM

## 2018-12-13 DIAGNOSIS — I10 Essential (primary) hypertension: Secondary | ICD-10-CM | POA: Diagnosis not present

## 2018-12-13 DIAGNOSIS — Z0000 Encounter for general adult medical examination without abnormal findings: Secondary | ICD-10-CM

## 2018-12-13 LAB — HEMOGLOBIN A1C: Hgb A1c MFr Bld: 6.1 % (ref 4.6–6.5)

## 2018-12-13 LAB — CBC WITH DIFFERENTIAL/PLATELET
Basophils Absolute: 0 10*3/uL (ref 0.0–0.1)
Basophils Relative: 0.8 % (ref 0.0–3.0)
Eosinophils Absolute: 0.1 10*3/uL (ref 0.0–0.7)
Eosinophils Relative: 1.4 % (ref 0.0–5.0)
HCT: 40.1 % (ref 36.0–46.0)
Hemoglobin: 13.5 g/dL (ref 12.0–15.0)
Lymphocytes Relative: 40 % (ref 12.0–46.0)
Lymphs Abs: 2.2 10*3/uL (ref 0.7–4.0)
MCHC: 33.8 g/dL (ref 30.0–36.0)
MCV: 91.5 fl (ref 78.0–100.0)
Monocytes Absolute: 0.4 10*3/uL (ref 0.1–1.0)
Monocytes Relative: 7.9 % (ref 3.0–12.0)
Neutro Abs: 2.8 10*3/uL (ref 1.4–7.7)
Neutrophils Relative %: 49.9 % (ref 43.0–77.0)
Platelets: 347 10*3/uL (ref 150.0–400.0)
RBC: 4.38 Mil/uL (ref 3.87–5.11)
RDW: 14.6 % (ref 11.5–15.5)
WBC: 5.6 10*3/uL (ref 4.0–10.5)

## 2018-12-13 LAB — COMPREHENSIVE METABOLIC PANEL WITH GFR
ALT: 20 U/L (ref 0–35)
AST: 11 U/L (ref 0–37)
Albumin: 4.8 g/dL (ref 3.5–5.2)
Alkaline Phosphatase: 87 U/L (ref 39–117)
BUN: 13 mg/dL (ref 6–23)
CO2: 30 meq/L (ref 19–32)
Calcium: 10.1 mg/dL (ref 8.4–10.5)
Chloride: 100 meq/L (ref 96–112)
Creatinine, Ser: 0.73 mg/dL (ref 0.40–1.20)
GFR: 96.05 mL/min
Glucose, Bld: 108 mg/dL — ABNORMAL HIGH (ref 70–99)
Potassium: 4.7 meq/L (ref 3.5–5.1)
Sodium: 140 meq/L (ref 135–145)
Total Bilirubin: 0.6 mg/dL (ref 0.2–1.2)
Total Protein: 7.4 g/dL (ref 6.0–8.3)

## 2018-12-13 LAB — LIPID PANEL
Cholesterol: 230 mg/dL — ABNORMAL HIGH (ref 0–200)
HDL: 39.4 mg/dL (ref >39.00)
LDL Cholesterol: 163 mg/dL — ABNORMAL HIGH (ref 0–99)
NonHDL: 190.52
Total CHOL/HDL Ratio: 6
Triglycerides: 137 mg/dL (ref 0.0–149.0)
VLDL: 27.4 mg/dL (ref 0.0–40.0)

## 2018-12-13 LAB — VITAMIN B12: Vitamin B-12: 1419 pg/mL — ABNORMAL HIGH (ref 211–911)

## 2018-12-13 LAB — TSH: TSH: 0.64 u[IU]/mL (ref 0.35–4.50)

## 2018-12-13 LAB — VITAMIN D 25 HYDROXY (VIT D DEFICIENCY, FRACTURES): VITD: 49.47 ng/mL (ref 30.00–100.00)

## 2018-12-13 MED ORDER — HYDROCHLOROTHIAZIDE 25 MG PO TABS: 25.0000 mg | ORAL_TABLET | Freq: Every day | ORAL | 1 refills | Status: DC

## 2018-12-13 MED ORDER — ATORVASTATIN CALCIUM 20 MG PO TABS: 20.0000 mg | ORAL_TABLET | Freq: Every day | ORAL | 1 refills | Status: DC

## 2018-12-13 NOTE — Addendum Note (Signed)
Addended by: Westley Hummer B on: 12/13/2018 04:28 PM   Modules accepted: Orders

## 2018-12-13 NOTE — Patient Instructions (Signed)
-Nice seeing you today!!  -Lab work today; will notify you once results are available.  -Pneumonia vaccine today.  -Shingles immunization series at the pharmacy.  -Make sure you have routine dental care.  -Schedule follow up in 6 months.   Preventive Care 67 Years and Older, Female Preventive care refers to lifestyle choices and visits with your health care provider that can promote health and wellness. This includes:  A yearly physical exam. This is also called an annual well check.  Regular dental and eye exams.  Immunizations.  Screening for certain conditions.  Healthy lifestyle choices, such as diet and exercise. What can I expect for my preventive care visit? Physical exam Your health care provider will check:  Height and weight. These may be used to calculate body mass index (BMI), which is a measurement that tells if you are at a healthy weight.  Heart rate and blood pressure.  Your skin for abnormal spots. Counseling Your health care provider may ask you questions about:  Alcohol, tobacco, and drug use.  Emotional well-being.  Home and relationship well-being.  Sexual activity.  Eating habits.  History of falls.  Memory and ability to understand (cognition).  Work and work Statistician.  Pregnancy and menstrual history. What immunizations do I need?  Influenza (flu) vaccine  This is recommended every year. Tetanus, diphtheria, and pertussis (Tdap) vaccine  You may need a Td booster every 10 years. Varicella (chickenpox) vaccine  You may need this vaccine if you have not already been vaccinated. Zoster (shingles) vaccine  You may need this after age 74. Pneumococcal conjugate (PCV13) vaccine  One dose is recommended after age 33. Pneumococcal polysaccharide (PPSV23) vaccine  One dose is recommended after age 46. Measles, mumps, and rubella (MMR) vaccine  You may need at least one dose of MMR if you were born in 1957 or later. You may  also need a second dose. Meningococcal conjugate (MenACWY) vaccine  You may need this if you have certain conditions. Hepatitis A vaccine  You may need this if you have certain conditions or if you travel or work in places where you may be exposed to hepatitis A. Hepatitis B vaccine  You may need this if you have certain conditions or if you travel or work in places where you may be exposed to hepatitis B. Haemophilus influenzae type b (Hib) vaccine  You may need this if you have certain conditions. You may receive vaccines as individual doses or as more than one vaccine together in one shot (combination vaccines). Talk with your health care provider about the risks and benefits of combination vaccines. What tests do I need? Blood tests  Lipid and cholesterol levels. These may be checked every 5 years, or more frequently depending on your overall health.  Hepatitis C test.  Hepatitis B test. Screening  Lung cancer screening. You may have this screening every year starting at age 13 if you have a 30-pack-year history of smoking and currently smoke or have quit within the past 15 years.  Colorectal cancer screening. All adults should have this screening starting at age 24 and continuing until age 59. Your health care provider may recommend screening at age 35 if you are at increased risk. You will have tests every 1-10 years, depending on your results and the type of screening test.  Diabetes screening. This is done by checking your blood sugar (glucose) after you have not eaten for a while (fasting). You may have this done every 1-3 years.  Mammogram. This may be done every 1-2 years. Talk with your health care provider about how often you should have regular mammograms.  BRCA-related cancer screening. This may be done if you have a family history of breast, ovarian, tubal, or peritoneal cancers. Other tests  Sexually transmitted disease (STD) testing.  Bone density scan. This is  done to screen for osteoporosis. You may have this done starting at age 59. Follow these instructions at home: Eating and drinking  Eat a diet that includes fresh fruits and vegetables, whole grains, lean protein, and low-fat dairy products. Limit your intake of foods with high amounts of sugar, saturated fats, and salt.  Take vitamin and mineral supplements as recommended by your health care provider.  Do not drink alcohol if your health care provider tells you not to drink.  If you drink alcohol: ? Limit how much you have to 0-1 drink a day. ? Be aware of how much alcohol is in your drink. In the [place] one drink equals one 12 oz bottle of beer (355 mL), one 5 oz glass of wine (148 mL), or one 1 oz glass of hard liquor (44 mL). Lifestyle  Take daily care of your teeth and gums.  Stay active. Exercise for at least 30 minutes on 5 or more days each week.  Do not use any products that contain nicotine or tobacco, such as cigarettes, e-cigarettes, and chewing tobacco. If you need help quitting, ask your health care provider.  If you are sexually active, practice safe sex. Use a condom or other form of protection in order to prevent STIs (sexually transmitted infections).  Talk with your health care provider about taking a low-dose aspirin or statin. What's next?  Go to your health care provider once a year for a well check visit.  Ask your health care provider how often you should have your eyes and teeth checked.  Stay up to date on all vaccines. This information is not intended to replace advice given to you by your health care provider. Make sure you discuss any questions you have with your health care provider. Document Released: 05/22/2015 Document Revised: 04/19/2018 Document Reviewed: 04/19/2018 Elsevier Patient Education  2020 [employer].

## 2018-12-13 NOTE — Progress Notes (Signed)
Established Patient Office Visit     CC/Reason for Visit: Annual preventive exam and subsequent Medicare wellness visit  HPI: Danielle Morse is a 68 y.o. female who is coming in today for the above mentioned reasons. Past Medical History is significant for: Well-controlled hypertension, hyperlipidemia not on statins, impaired glucose tolerance.  She has no acute complaints today.  She had cataract surgery last week and is still wearing the bandages, otherwise has been doing quite well.  She has had a total abdominal hysterectomy and was told by her GYN that she no longer needed Pap smears.  Her mammograms and colonoscopies are up-to-date, she will receive Pneumovax today, she has routine eye care but does not have dental care.   Past Medical/Surgical History: Past Medical History:  Diagnosis Date  . Anxiety   . Depression   . DVT (deep venous thrombosis) ([place])    History over 30 yrs ago, no further problems  . HYPERLIPIDEMIA 06/05/2008  . MENOPAUSAL SYNDROME 06/05/2008    Past Surgical History:  Procedure Laterality Date  . ABDOMINAL HYSTERECTOMY    . CESAREAN SECTION     x 2  . COLONOSCOPY  2005  . CYSTECTOMY     left elbow  . LAPAROSCOPIC OVARIAN CYSTECTOMY      Social History:  reports that she has never smoked. She has never used smokeless tobacco. She reports that she does not drink alcohol or use drugs.  Allergies: No Known Allergies  Family History:  Family History  Problem Relation Age of Onset  . Heart disease Father   . Cancer Sister   . Colon cancer Neg Hx      Current Outpatient Medications:  .  cholecalciferol (VITAMIN D) 1000 UNITS tablet, Take 1,000 Units by mouth daily., Disp: , Rfl:  .  hydrochlorothiazide (HYDRODIURIL) 25 MG tablet, Take 1 tablet (25 mg total) by mouth daily., Disp: 90 tablet, Rfl: 1 .  Multiple Vitamin (MULTIVITAMIN) tablet, Take 1 tablet by mouth daily., Disp: , Rfl:   Review of Systems:  Constitutional: Denies  fever, chills, diaphoresis, appetite change and fatigue.  HEENT: Denies photophobia, eye pain, redness, hearing loss, ear pain, congestion, sore throat, rhinorrhea, sneezing, mouth sores, trouble swallowing, neck pain, neck stiffness and tinnitus.   Respiratory: Denies SOB, DOE, cough, chest tightness,  and wheezing.   Cardiovascular: Denies chest pain, palpitations and leg swelling.  Gastrointestinal: Denies nausea, vomiting, abdominal pain, diarrhea, constipation, blood in stool and abdominal distention.  Genitourinary: Denies dysuria, urgency, frequency, hematuria, flank pain and difficulty urinating.  Endocrine: Denies: hot or cold intolerance, sweats, changes in hair or nails, polyuria, polydipsia. Musculoskeletal: Denies myalgias, back pain, joint swelling, arthralgias and gait problem.  Skin: Denies pallor, rash and wound.  Neurological: Denies dizziness, seizures, syncope, weakness, light-headedness, numbness and headaches.  Hematological: Denies adenopathy. Easy bruising, personal or family bleeding history  Psychiatric/Behavioral: Denies suicidal ideation, mood changes, confusion, nervousness, sleep disturbance and agitation    Physical Exam: Vitals:   12/13/18 0957  BP: 120/80  Pulse: 67  Temp: 98 F (36.7 C)  TempSrc: Temporal  SpO2: 96%  Weight: 187 lb 14.4 oz (85.2 kg)  Height: _0  (1.702 m)    Body mass index is 29.43 kg/m.   Constitutional: NAD, calm, comfortable Eyes: PERRL, lids and conjunctivae normal on the right, left has not been examined as she is still wearing bandages from recent neck surgery ENMT: Mucous membranes are moist. Tympanic membrane is pearly white, no erythema or bulging.  Neck: normal, supple, no masses, no thyromegaly Respiratory: clear to auscultation bilaterally, no wheezing, no crackles. Normal respiratory effort. No accessory muscle use.  Cardiovascular: Regular rate and rhythm, no murmurs / rubs / gallops. No extremity edema. 2+ pedal  pulses. No carotid bruits.  Abdomen: no tenderness, no masses palpated. No hepatosplenomegaly. Bowel sounds positive.  Musculoskeletal: no clubbing / cyanosis. No joint deformity upper and lower extremities. Good ROM, no contractures. Normal muscle tone.  Skin: no rashes, lesions, ulcers. No induration Neurologic: CN 2-12 grossly intact. Sensation intact, DTR normal. Strength 5/5 in all 4.  Psychiatric: Normal judgment and insight. Alert and oriented x 3. Normal mood.   Subsequent Medicare wellness visit   1. Risk factors, based on past  M,S,F -cardiovascular disease risk factors include history of hypertension   2.  Physical activities: She walks 2-3 times a week   3.  Depression/mood:  Mood is stable not depressed   4.  Hearing:  No issues   5.  ADL's: Independent in all ADLs   6.  Fall risk:  Low fall risk   7.  Home safety: No problems identified   8.  Height weight, and visual acuity: Height and weight as above, we have not done visual acuity today as she recently had cataract surgery and is still in bandages.   9.  Counseling:  Advised to follow-up with ophthalmologist as scheduled, advised to secure routine dental care and recommended to get shingles immunization series at the pharmacy   10. Lab orders based on risk factors: Laboratory update will be reviewed   11. Referral :  None today   12. Care plan:  Follow-up with me in 6 months   13. Cognitive assessment:  No cognitive impairment has been identified   14. Screening: Patient provided with a written and personalized 5-10 year screening schedule in the AVS.   yes   15. Provider List Update:   PCP, ophthalmologist (Dr. Katy Fitch).  16. Advance Directives: Full code     Office Visit from 12/13/2018 in [place] at [place]  PHQ-9 Total Score  0      Fall Risk  12/13/2018 12/11/2017  Falls in the past year? 0 No  Number falls in past yr: 0 -  Injury with Fall? 0 -     Impression and Plan:  Encounter  for preventive health examination -Has routine eye care. -Have advised routine dental care. -She will receive Pneumovax today, have advised shingles immunization series at pharmacy. -Healthy lifestyle has been discussed in detail today. -Screening labs to be performed today. -She had a colonoscopy in 2016 and is a 10-year callback. -She recently had a mammogram that was negative in June 2020. -Per GYN with no further need for cervical cancer screening given total hysterectomy. -DEXA scan in May 2019 without evidence for osteoporosis or osteopenia.  Essential hypertension  -Well-controlled on current medications.  Impaired glucose tolerance  -A1c was 6.5 in August 2019 which actually puts her in the diabetic range. -Will await A1c results today to further determine course of action.  Dyslipidemia  -Last LDL was 160 in August 2019. -She is not on a statin. -Will likely need to go on statin therapy if she does indeed end up being a diabetic based on blood work today.    Patient Instructions  -Nice seeing you today!!  -Lab work today; will notify you once results are available.  -Pneumonia vaccine today.  -Shingles immunization series at the pharmacy.  -Make sure you have  routine dental care.  -Schedule follow up in 6 months.   Preventive Care 13 Years and Older, Female Preventive care refers to lifestyle choices and visits with your health care provider that can promote health and wellness. This includes:  A yearly physical exam. This is also called an annual well check.  Regular dental and eye exams.  Immunizations.  Screening for certain conditions.  Healthy lifestyle choices, such as diet and exercise. What can I expect for my preventive care visit? Physical exam Your health care provider will check:  Height and weight. These may be used to calculate body mass index (BMI), which is a measurement that tells if you are at a healthy weight.  Heart rate and blood  pressure.  Your skin for abnormal spots. Counseling Your health care provider may ask you questions about:  Alcohol, tobacco, and drug use.  Emotional well-being.  Home and relationship well-being.  Sexual activity.  Eating habits.  History of falls.  Memory and ability to understand (cognition).  Work and work Statistician.  Pregnancy and menstrual history. What immunizations do I need?  Influenza (flu) vaccine  This is recommended every year. Tetanus, diphtheria, and pertussis (Tdap) vaccine  You may need a Td booster every 10 years. Varicella (chickenpox) vaccine  You may need this vaccine if you have not already been vaccinated. Zoster (shingles) vaccine  You may need this after age 19. Pneumococcal conjugate (PCV13) vaccine  One dose is recommended after age 17. Pneumococcal polysaccharide (PPSV23) vaccine  One dose is recommended after age 35. Measles, mumps, and rubella (MMR) vaccine  You may need at least one dose of MMR if you were born in 1957 or later. You may also need a second dose. Meningococcal conjugate (MenACWY) vaccine  You may need this if you have certain conditions. Hepatitis A vaccine  You may need this if you have certain conditions or if you travel or work in places where you may be exposed to hepatitis A. Hepatitis B vaccine  You may need this if you have certain conditions or if you travel or work in places where you may be exposed to hepatitis B. Haemophilus influenzae type b (Hib) vaccine  You may need this if you have certain conditions. You may receive vaccines as individual doses or as more than one vaccine together in one shot (combination vaccines). Talk with your health care provider about the risks and benefits of combination vaccines. What tests do I need? Blood tests  Lipid and cholesterol levels. These may be checked every 5 years, or more frequently depending on your overall health.  Hepatitis C test.  Hepatitis B  test. Screening  Lung cancer screening. You may have this screening every year starting at age 54 if you have a 30-pack-year history of smoking and currently smoke or have quit within the past 15 years.  Colorectal cancer screening. All adults should have this screening starting at age 40 and continuing until age 51. Your health care provider may recommend screening at age 80 if you are at increased risk. You will have tests every 1-10 years, depending on your results and the type of screening test.  Diabetes screening. This is done by checking your blood sugar (glucose) after you have not eaten for a while (fasting). You may have this done every 1-3 years.  Mammogram. This may be done every 1-2 years. Talk with your health care provider about how often you should have regular mammograms.  BRCA-related cancer screening. This may be done  if you have a family history of breast, ovarian, tubal, or peritoneal cancers. Other tests  Sexually transmitted disease (STD) testing.  Bone density scan. This is done to screen for osteoporosis. You may have this done starting at age 36. Follow these instructions at home: Eating and drinking  Eat a diet that includes fresh fruits and vegetables, whole grains, lean protein, and low-fat dairy products. Limit your intake of foods with high amounts of sugar, saturated fats, and salt.  Take vitamin and mineral supplements as recommended by your health care provider.  Do not drink alcohol if your health care provider tells you not to drink.  If you drink alcohol: ? Limit how much you have to 0-1 drink a day. ? Be aware of how much alcohol is in your drink. In the [place] one drink equals one 12 oz bottle of beer (355 mL), one 5 oz glass of wine (148 mL), or one 1 oz glass of hard liquor (44 mL). Lifestyle  Take daily care of your teeth and gums.  Stay active. Exercise for at least 30 minutes on 5 or more days each week.  Do not use any products that  contain nicotine or tobacco, such as cigarettes, e-cigarettes, and chewing tobacco. If you need help quitting, ask your health care provider.  If you are sexually active, practice safe sex. Use a condom or other form of protection in order to prevent STIs (sexually transmitted infections).  Talk with your health care provider about taking a low-dose aspirin or statin. What's next?  Go to your health care provider once a year for a well check visit.  Ask your health care provider how often you should have your eyes and teeth checked.  Stay up to date on all vaccines. This information is not intended to replace advice given to you by your health care provider. Make sure you discuss any questions you have with your health care provider. Document Released: 05/22/2015 Document Revised: 04/19/2018 Document Reviewed: 04/19/2018 Elsevier Patient Education  2020 [place], MD [facility]

## 2018-12-17 ENCOUNTER — Telehealth: Payer: Self-pay | Admitting: Internal Medicine

## 2018-12-17 NOTE — Telephone Encounter (Signed)
Scheduled 8 week labs with patient

## 2018-12-17 NOTE — Telephone Encounter (Signed)
Patient needs 8 wk labs, however there are no lab orders in the system.

## 2018-12-18 ENCOUNTER — Other Ambulatory Visit: Payer: Self-pay | Admitting: Internal Medicine

## 2018-12-18 DIAGNOSIS — E876 Hypokalemia: Secondary | ICD-10-CM

## 2018-12-18 DIAGNOSIS — E785 Hyperlipidemia, unspecified: Secondary | ICD-10-CM

## 2018-12-18 NOTE — Progress Notes (Signed)
Orders placed.

## 2019-01-20 DIAGNOSIS — H2511 Age-related nuclear cataract, right eye: Secondary | ICD-10-CM | POA: Diagnosis not present

## 2019-01-24 DIAGNOSIS — H2511 Age-related nuclear cataract, right eye: Secondary | ICD-10-CM | POA: Diagnosis not present

## 2019-02-11 ENCOUNTER — Other Ambulatory Visit: Payer: Self-pay

## 2019-02-11 ENCOUNTER — Other Ambulatory Visit (INDEPENDENT_AMBULATORY_CARE_PROVIDER_SITE_OTHER): Payer: PPO

## 2019-02-11 DIAGNOSIS — E876 Hypokalemia: Secondary | ICD-10-CM | POA: Diagnosis not present

## 2019-02-11 DIAGNOSIS — E785 Hyperlipidemia, unspecified: Secondary | ICD-10-CM

## 2019-02-11 LAB — COMPREHENSIVE METABOLIC PANEL
ALT: 24 U/L (ref 0–35)
AST: 13 U/L (ref 0–37)
Albumin: 4.4 g/dL (ref 3.5–5.2)
Alkaline Phosphatase: 81 U/L (ref 39–117)
BUN: 12 mg/dL (ref 6–23)
CO2: 33 mEq/L — ABNORMAL HIGH (ref 19–32)
Calcium: 9.7 mg/dL (ref 8.4–10.5)
Chloride: 101 mEq/L (ref 96–112)
Creatinine, Ser: 0.68 mg/dL (ref 0.40–1.20)
GFR: 104.2 mL/min (ref >60.00)
Glucose, Bld: 117 mg/dL — ABNORMAL HIGH (ref 70–99)
Potassium: 4.3 mEq/L (ref 3.5–5.1)
Sodium: 141 mEq/L (ref 135–145)
Total Bilirubin: 0.5 mg/dL (ref 0.2–1.2)
Total Protein: 6.9 g/dL (ref 6.0–8.3)

## 2019-02-11 LAB — LIPID PANEL
Cholesterol: 133 mg/dL (ref 0–200)
HDL: 31.8 mg/dL — ABNORMAL LOW (ref >39.00)
LDL Cholesterol: 68 mg/dL (ref 0–99)
NonHDL: 101.35
Total CHOL/HDL Ratio: 4
Triglycerides: 167 mg/dL — ABNORMAL HIGH (ref 0.0–149.0)
VLDL: 33.4 mg/dL (ref 0.0–40.0)

## 2019-02-14 ENCOUNTER — Telehealth: Payer: Self-pay | Admitting: *Deleted

## 2019-02-14 NOTE — Telephone Encounter (Signed)
Patient calling for lab results:  Notes recorded by Isaac Bliss, Rayford Halsted, MD on 02/12/2019 at 11:30 AM EDT  Significant improvement in cholesterol after stating statin.  Patient notified of lab result and scheduled for requested flu vaccine.

## 2019-03-14 ENCOUNTER — Other Ambulatory Visit: Payer: Self-pay

## 2019-03-14 ENCOUNTER — Ambulatory Visit (INDEPENDENT_AMBULATORY_CARE_PROVIDER_SITE_OTHER): Payer: PPO

## 2019-03-14 DIAGNOSIS — Z23 Encounter for immunization: Secondary | ICD-10-CM | POA: Diagnosis not present

## 2019-06-06 ENCOUNTER — Other Ambulatory Visit: Payer: Self-pay | Admitting: Internal Medicine

## 2019-06-06 DIAGNOSIS — E785 Hyperlipidemia, unspecified: Secondary | ICD-10-CM

## 2019-06-18 ENCOUNTER — Ambulatory Visit (INDEPENDENT_AMBULATORY_CARE_PROVIDER_SITE_OTHER): Payer: PPO | Admitting: Internal Medicine

## 2019-06-18 ENCOUNTER — Other Ambulatory Visit: Payer: Self-pay

## 2019-06-18 ENCOUNTER — Encounter: Payer: Self-pay | Admitting: Internal Medicine

## 2019-06-18 VITALS — BP 130/70 | HR 75 | Temp 97.3°F | Wt 195.2 lb

## 2019-06-18 DIAGNOSIS — I10 Essential (primary) hypertension: Secondary | ICD-10-CM | POA: Diagnosis not present

## 2019-06-18 DIAGNOSIS — E785 Hyperlipidemia, unspecified: Secondary | ICD-10-CM | POA: Diagnosis not present

## 2019-06-18 DIAGNOSIS — R7302 Impaired glucose tolerance (oral): Secondary | ICD-10-CM

## 2019-06-18 DIAGNOSIS — E669 Obesity, unspecified: Secondary | ICD-10-CM

## 2019-06-18 DIAGNOSIS — E66811 Obesity, class 1: Secondary | ICD-10-CM

## 2019-06-18 LAB — POCT GLYCOSYLATED HEMOGLOBIN (HGB A1C): Hemoglobin A1C: 6.1 % — AB (ref 4.0–5.6)

## 2019-06-18 NOTE — Progress Notes (Signed)
Established Patient Office Visit     This visit occurred during the SARS-CoV-2 public health emergency.  Safety protocols were in place, including screening questions prior to the visit, additional usage of staff PPE, and extensive cleaning of exam room while observing appropriate contact time as indicated for disinfecting solutions.    CC/Reason for Visit: 82-month follow-up chronic conditions  HPI: Danielle Morse is a 69 y.o. female who is coming in today for the above mentioned reasons. Past Medical History is significant for: Hypertension, impaired glucose tolerance with an A1c of 6.1 in the office, hyperlipidemia started on 20 mg of Lipitor in August with LDL cholesterol improved from 163-68 in 8 weeks.  She has no acute complaints today.  She is feeling well.  She is on the waiting list for the Covid vaccination.   Past Medical/Surgical History: Past Medical History:  Diagnosis Date  . Anxiety   . Depression   . DVT (deep venous thrombosis) ([place])    History over 30 yrs ago, no further problems  . HYPERLIPIDEMIA 06/05/2008  . MENOPAUSAL SYNDROME 06/05/2008    Past Surgical History:  Procedure Laterality Date  . ABDOMINAL HYSTERECTOMY    . CESAREAN SECTION     x 2  . COLONOSCOPY  2005  . CYSTECTOMY     left elbow  . LAPAROSCOPIC OVARIAN CYSTECTOMY      Social History:  reports that she has never smoked. She has never used smokeless tobacco. She reports that she does not drink alcohol or use drugs.  Allergies: No Known Allergies  Family History:  Family History  Problem Relation Age of Onset  . Heart disease Father   . Cancer Sister   . Colon cancer Neg Hx      Current Outpatient Medications:  .  atorvastatin (LIPITOR) 20 MG tablet, TAKE 1 TABLET BY MOUTH EVERY DAY, Disp: 90 tablet, Rfl: 1 .  cholecalciferol (VITAMIN D) 1000 UNITS tablet, Take 1,000 Units by mouth daily., Disp: , Rfl:  .  hydrochlorothiazide (HYDRODIURIL) 25 MG tablet, Take 1  tablet (25 mg total) by mouth daily., Disp: 90 tablet, Rfl: 1 .  Multiple Vitamin (MULTIVITAMIN) tablet, Take 1 tablet by mouth daily., Disp: , Rfl:   Review of Systems:  Constitutional: Denies fever, chills, diaphoresis, appetite change and fatigue.  HEENT: Denies photophobia, eye pain, redness, hearing loss, ear pain, congestion, sore throat, rhinorrhea, sneezing, mouth sores, trouble swallowing, neck pain, neck stiffness and tinnitus.   Respiratory: Denies SOB, DOE, cough, chest tightness,  and wheezing.   Cardiovascular: Denies chest pain, palpitations and leg swelling.  Gastrointestinal: Denies nausea, vomiting, abdominal pain, diarrhea, constipation, blood in stool and abdominal distention.  Genitourinary: Denies dysuria, urgency, frequency, hematuria, flank pain and difficulty urinating.  Endocrine: Denies: hot or cold intolerance, sweats, changes in hair or nails, polyuria, polydipsia. Musculoskeletal: Denies myalgias, back pain, joint swelling, arthralgias and gait problem.  Skin: Denies pallor, rash and wound.  Neurological: Denies dizziness, seizures, syncope, weakness, light-headedness, numbness and headaches.  Hematological: Denies adenopathy. Easy bruising, personal or family bleeding history  Psychiatric/Behavioral: Denies suicidal ideation, mood changes, confusion, nervousness, sleep disturbance and agitation    Physical Exam: Vitals:   06/18/19 1103  BP: 130/70  Pulse: 75  Temp: (!) 97.3 F (36.3 C)  TempSrc: Temporal  SpO2: 97%  Weight: 195 lb 3.2 oz (88.5 kg)    Body mass index is 30.57 kg/m.   Constitutional: NAD, calm, comfortable Eyes: PERRL, lids and conjunctivae normal, wears corrective  lenses ENMT: Mucous membranes are moist. Respiratory: clear to auscultation bilaterally, no wheezing, no crackles. Normal respiratory effort. No accessory muscle use.  Cardiovascular: Regular rate and rhythm, no murmurs / rubs / gallops.  Trace bilateral lower extremity  edema.  Neurologic: Grossly intact and nonfocal Psychiatric: Normal judgment and insight. Alert and oriented x 3. Normal mood.    Impression and Plan:  Impaired glucose tolerance  -A1c remains stable at 6.1 today. -We have discussed importance of low carbohydrate diet, weight loss and increased physical activity to avoid progression  Dyslipidemia -Significant improvement in LDL after starting statin.  Essential hypertension -Well-controlled on current regimen.  Obesity (BMI 30.0-34.9) -Discussed healthy lifestyle, including increased physical activity and better food choices to promote weight loss.   Patient Instructions  -Nice seeing you today!!  -Schedule a 6 month follow up for your physical.     [name], MD [facility]

## 2019-06-18 NOTE — Patient Instructions (Signed)
-  Nice seeing you today!!  -Schedule a 6 month follow up for your physical.

## 2019-06-30 ENCOUNTER — Ambulatory Visit: Payer: PPO | Attending: Internal Medicine

## 2019-06-30 DIAGNOSIS — Z23 Encounter for immunization: Secondary | ICD-10-CM

## 2019-06-30 NOTE — Progress Notes (Signed)
Covid-19 Vaccination Clinic  Name:  Danielle Morse    MRN: [id number] DOB: 1950/12/23  06/30/2019  Danielle Morse was observed post Covid-19 immunization for 15 minutes without incidence. She was provided with Vaccine Information Sheet and instruction to access the V-Safe system.   Danielle Morse was instructed to call 911 with any severe reactions post vaccine: Marland Kitchen Difficulty breathing  . Swelling of your face and throat  . A fast heartbeat  . A bad rash all over your body  . Dizziness and weakness    Immunizations Administered    Name Date Dose VIS Date Route   Pfizer COVID-19 Vaccine 06/30/2019 12:25 PM 0.3 mL 04/19/2019 Intramuscular   Manufacturer: [employer], [employer]   Lot: [id number]   NDC: [address]-2

## 2019-07-10 ENCOUNTER — Other Ambulatory Visit: Payer: Self-pay | Admitting: Internal Medicine

## 2019-07-10 DIAGNOSIS — I10 Essential (primary) hypertension: Secondary | ICD-10-CM

## 2019-07-22 ENCOUNTER — Ambulatory Visit: Payer: PPO | Attending: Internal Medicine

## 2019-07-22 DIAGNOSIS — Z23 Encounter for immunization: Secondary | ICD-10-CM

## 2019-07-22 NOTE — Progress Notes (Signed)
Covid-19 Vaccination Clinic  Name:  Danielle Morse    MRN: [id number] DOB: 1951/01/07  07/22/2019  Danielle Morse was observed post Covid-19 immunization for 15 minutes without incident. She was provided with Vaccine Information Sheet and instruction to access the V-Safe system.   Danielle Morse was instructed to call 911 with any severe reactions post vaccine: Marland Kitchen Difficulty breathing  . Swelling of face and throat  . A fast heartbeat  . A bad rash all over body  . Dizziness and weakness   Immunizations Administered    Name Date Dose VIS Date Route   Pfizer COVID-19 Vaccine 07/22/2019 11:06 AM 0.3 mL 04/19/2019 Intramuscular   Manufacturer: [employer], [employer]   Lot: [id number]   NDC: [address]-4

## 2019-10-03 ENCOUNTER — Other Ambulatory Visit: Payer: Self-pay | Admitting: Emergency Medicine

## 2019-10-03 ENCOUNTER — Other Ambulatory Visit: Payer: Self-pay | Admitting: Internal Medicine

## 2019-10-03 DIAGNOSIS — Z1231 Encounter for screening mammogram for malignant neoplasm of breast: Secondary | ICD-10-CM

## 2019-11-05 ENCOUNTER — Other Ambulatory Visit: Payer: Self-pay

## 2019-11-05 ENCOUNTER — Ambulatory Visit
Admission: RE | Admit: 2019-11-05 | Discharge: 2019-11-05 | Disposition: A | Discharge status: Home / Self Care | Payer: PPO | Source: Ambulatory Visit | Attending: Internal Medicine | Admitting: Internal Medicine

## 2019-11-05 DIAGNOSIS — Z1231 Encounter for screening mammogram for malignant neoplasm of breast: Secondary | ICD-10-CM | POA: Diagnosis not present

## 2019-11-28 ENCOUNTER — Other Ambulatory Visit: Payer: Self-pay | Admitting: Internal Medicine

## 2019-11-28 DIAGNOSIS — E785 Hyperlipidemia, unspecified: Secondary | ICD-10-CM

## 2019-12-17 ENCOUNTER — Other Ambulatory Visit: Payer: Self-pay

## 2019-12-17 ENCOUNTER — Ambulatory Visit (INDEPENDENT_AMBULATORY_CARE_PROVIDER_SITE_OTHER): Payer: PPO | Admitting: Internal Medicine

## 2019-12-17 ENCOUNTER — Encounter: Payer: Self-pay | Admitting: Internal Medicine

## 2019-12-17 ENCOUNTER — Other Ambulatory Visit: Payer: Self-pay | Admitting: Internal Medicine

## 2019-12-17 VITALS — BP 130/80 | HR 90 | Temp 97.9°F | Ht 66.5 in | Wt 191.0 lb

## 2019-12-17 DIAGNOSIS — Z13820 Encounter for screening for osteoporosis: Secondary | ICD-10-CM | POA: Diagnosis not present

## 2019-12-17 DIAGNOSIS — R7302 Impaired glucose tolerance (oral): Secondary | ICD-10-CM

## 2019-12-17 DIAGNOSIS — Z780 Asymptomatic menopausal state: Secondary | ICD-10-CM

## 2019-12-17 DIAGNOSIS — H6121 Impacted cerumen, right ear: Secondary | ICD-10-CM | POA: Diagnosis not present

## 2019-12-17 DIAGNOSIS — I10 Essential (primary) hypertension: Secondary | ICD-10-CM | POA: Diagnosis not present

## 2019-12-17 DIAGNOSIS — E785 Hyperlipidemia, unspecified: Secondary | ICD-10-CM

## 2019-12-17 DIAGNOSIS — Z0001 Encounter for general adult medical examination with abnormal findings: Secondary | ICD-10-CM | POA: Diagnosis not present

## 2019-12-17 DIAGNOSIS — Z0000 Encounter for general adult medical examination without abnormal findings: Secondary | ICD-10-CM

## 2019-12-17 NOTE — Addendum Note (Signed)
Addended by: Westley Hummer B on: 12/17/2019 08:59 AM   Modules accepted: Orders

## 2019-12-17 NOTE — Progress Notes (Signed)
Established Patient Office Visit     This visit occurred during the SARS-CoV-2 public health emergency.  Safety protocols were in place, including screening questions prior to the visit, additional usage of staff PPE, and extensive cleaning of exam room while observing appropriate contact time as indicated for disinfecting solutions.    CC/Reason for Visit: Annual preventive exam and subsequent Medicare wellness visit  HPI: Danielle Morse is a 69 y.o. female who is coming in today for the above mentioned reasons. Past Medical History is significant for: Hypertension, impaired glucose tolerance, hyperlipidemia started on 20 mg of Lipitor in August 2020 with LDL cholesterol improved from 163-68 in 8 weeks.  She has no acute complaints today.  She is feeling well.  She has had both of her Covid vaccines, she had a colonoscopy in 2016, she had a mammogram in June of this year.  She has routine eye and dental care.  She has been having some mildly decreased hearing out of her right ear.   Past Medical/Surgical History: Past Medical History:  Diagnosis Date  . Anxiety   . Depression   . DVT (deep venous thrombosis) ([place])    History over 30 yrs ago, no further problems  . HYPERLIPIDEMIA 06/05/2008  . MENOPAUSAL SYNDROME 06/05/2008    Past Surgical History:  Procedure Laterality Date  . ABDOMINAL HYSTERECTOMY    . CESAREAN SECTION     x 2  . COLONOSCOPY  2005  . CYSTECTOMY     left elbow  . LAPAROSCOPIC OVARIAN CYSTECTOMY      Social History:  reports that she has never smoked. She has never used smokeless tobacco. She reports that she does not drink alcohol and does not use drugs.  Allergies: No Known Allergies  Family History:  Family History  Problem Relation Age of Onset  . Heart disease Father   . Cancer Sister   . Colon cancer Neg Hx      Current Outpatient Medications:  .  atorvastatin (LIPITOR) 20 MG tablet, TAKE 1 TABLET BY MOUTH EVERY DAY, Disp: 90  tablet, Rfl: 0 .  cholecalciferol (VITAMIN D) 1000 UNITS tablet, Take 1,000 Units by mouth daily., Disp: , Rfl:  .  hydrochlorothiazide (HYDRODIURIL) 25 MG tablet, TAKE 1 TABLET BY MOUTH EVERY DAY, Disp: 90 tablet, Rfl: 1 .  Multiple Vitamin (MULTIVITAMIN) tablet, Take 1 tablet by mouth daily., Disp: , Rfl:   Review of Systems:  Constitutional: Denies fever, chills, diaphoresis, appetite change and fatigue.  HEENT: Denies photophobia, eye pain, redness, hearing loss, ear pain, congestion, sore throat, rhinorrhea, sneezing, mouth sores, trouble swallowing, neck pain, neck stiffness and tinnitus.   Respiratory: Denies SOB, DOE, cough, chest tightness,  and wheezing.   Cardiovascular: Denies chest pain, palpitations and leg swelling.  Gastrointestinal: Denies nausea, vomiting, abdominal pain, diarrhea, constipation, blood in stool and abdominal distention.  Genitourinary: Denies dysuria, urgency, frequency, hematuria, flank pain and difficulty urinating.  Endocrine: Denies: hot or cold intolerance, sweats, changes in hair or nails, polyuria, polydipsia. Musculoskeletal: Denies myalgias, back pain, joint swelling, arthralgias and gait problem.  Skin: Denies pallor, rash and wound.  Neurological: Denies dizziness, seizures, syncope, weakness, light-headedness, numbness and headaches.  Hematological: Denies adenopathy. Easy bruising, personal or family bleeding history  Psychiatric/Behavioral: Denies suicidal ideation, mood changes, confusion, nervousness, sleep disturbance and agitation    Physical Exam: Vitals:   12/17/19 0835  BP: 130/80  Pulse: 90  Temp: 97.9 F (36.6 C)  TempSrc: Oral  SpO2: 98%  Weight: 191 lb (86.6 kg)  Height: 5' 6.5" (1.689 m)    Body mass index is 30.37 kg/m.   Constitutional: NAD, calm, comfortable Eyes: PERRL, lids and conjunctivae normal, wears corrective lenses ENMT: Mucous membranes are moist.Tympanic membrane is pearly white, no erythema or bulging  on the left, right is obstructed by cerumen. Neck: normal, supple, no masses, no thyromegaly Respiratory: clear to auscultation bilaterally, no wheezing, no crackles. Normal respiratory effort. No accessory muscle use.  Cardiovascular: Regular rate and rhythm, no murmurs / rubs / gallops. No extremity edema. 2+ pedal pulses. No carotid bruits.  Abdomen: no tenderness, no masses palpated. No hepatosplenomegaly. Bowel sounds positive.  Musculoskeletal: no clubbing / cyanosis. No joint deformity upper and lower extremities. Good ROM, no contractures. Normal muscle tone.  Skin: no rashes, lesions, ulcers. No induration Neurologic: CN 2-12 grossly intact. Sensation intact, DTR normal. Strength 5/5 in all 4.  Psychiatric: Normal judgment and insight. Alert and oriented x 3. Normal mood.    Subsequent Medicare wellness visit   1. Risk factors, based on past  M,S,F -cardiovascular disease risk factors include age, history of hypertension, history of hyperlipidemia   2.  Physical activities: She is very sedentary   3.  Depression/mood:  Stable, not depressed   4.  Hearing:  Decreased hearing out of the right ear that is recent and she believes is due to earwax accumulation   5.  ADL's: Independent in all ADLs   6.  Fall risk:  Low fall risk   7.  Home safety: No problems identified   8.  Height weight, and visual acuity: Height and weight as above, visual acuity is 20/25 on the left, 20/20 on the right and 20/20 with eyes together   9.  Counseling:  Advised healthy lifestyle including increased physical activity   10. Lab orders based on risk factors: Laboratory update will be reviewed   11. Referral :  None today   12. Care plan:  Follow-up with me in 6 months   13. Cognitive assessment:  No cognitive impairment   14. Screening: Patient provided with a written and personalized 5-10 year screening schedule in the AVS.   yes   15. Provider List Update:   PCP, ophthalmologist  16.  Advance Directives: Full code     Office Visit from 12/17/2019 in [place] at [facility]  PHQ-9 Total Score 2      Fall Risk  12/17/2019 12/13/2018 12/11/2017  Falls in the past year? 0 0 No  Number falls in past yr: 0 0 -  Injury with Fall? 0 0 -     Impression and Plan:  Encounter for preventive health examination  -She has routine eye and dental care. -All immunizations are up-to-date including Covid. -Screening labs today. -Healthy lifestyle discussed in detail. -She had a colonoscopy in 2016 and was a 10-year callback. -She had a negative mammogram in June 2021. She has a GYN who does her Pap smears. -She had a bone density in 2019, repeat today.  Hearing loss of right ear due to cerumen impaction -Cerumen Desimpaction  After patient consent was obtained, warm water was applied and gentle ear lavage performed on right ear. There were no complications and following the desimpaction the tympanic membranes were visible. Tympanic membranes are intact following the procedure. Auditory canals are normal. The patient reported relief of symptoms after removal of cerumen.   Impaired glucose tolerance -Check A1c today.  Essential hypertension  -Well-controlled on hydrochlorothiazide.  Dyslipidemia  -  Plan: Lipid panel -Continue atorvastatin.    Patient Instructions  -Nice seeing you today!!  -Lab work today; will notify you once results are available.  -See you back in 6 months.   Preventive Care 84 Years and Older, Female Preventive care refers to lifestyle choices and visits with your health care provider that can promote health and wellness. This includes:  A yearly physical exam. This is also called an annual well check.  Regular dental and eye exams.  Immunizations.  Screening for certain conditions.  Healthy lifestyle choices, such as diet and exercise. What can I expect for my preventive care visit? Physical exam Your health care provider will  check:  Height and weight. These may be used to calculate body mass index (BMI), which is a measurement that tells if you are at a healthy weight.  Heart rate and blood pressure.  Your skin for abnormal spots. Counseling Your health care provider may ask you questions about:  Alcohol, tobacco, and drug use.  Emotional well-being.  Home and relationship well-being.  Sexual activity.  Eating habits.  History of falls.  Memory and ability to understand (cognition).  Work and work Statistician.  Pregnancy and menstrual history. What immunizations do I need?  Influenza (flu) vaccine  This is recommended every year. Tetanus, diphtheria, and pertussis (Tdap) vaccine  You may need a Td booster every 10 years. Varicella (chickenpox) vaccine  You may need this vaccine if you have not already been vaccinated. Zoster (shingles) vaccine  You may need this after age 22. Pneumococcal conjugate (PCV13) vaccine  One dose is recommended after age 71. Pneumococcal polysaccharide (PPSV23) vaccine  One dose is recommended after age 62. Measles, mumps, and rubella (MMR) vaccine  You may need at least one dose of MMR if you were born in 1957 or later. You may also need a second dose. Meningococcal conjugate (MenACWY) vaccine  You may need this if you have certain conditions. Hepatitis A vaccine  You may need this if you have certain conditions or if you travel or work in places where you may be exposed to hepatitis A. Hepatitis B vaccine  You may need this if you have certain conditions or if you travel or work in places where you may be exposed to hepatitis B. Haemophilus influenzae type b (Hib) vaccine  You may need this if you have certain conditions. You may receive vaccines as individual doses or as more than one vaccine together in one shot (combination vaccines). Talk with your health care provider about the risks and benefits of combination vaccines. What tests do I  need? Blood tests  Lipid and cholesterol levels. These may be checked every 5 years, or more frequently depending on your overall health.  Hepatitis C test.  Hepatitis B test. Screening  Lung cancer screening. You may have this screening every year starting at age 6 if you have a 30-pack-year history of smoking and currently smoke or have quit within the past 15 years.  Colorectal cancer screening. All adults should have this screening starting at age 47 and continuing until age 44. Your health care provider may recommend screening at age 52 if you are at increased risk. You will have tests every 1-10 years, depending on your results and the type of screening test.  Diabetes screening. This is done by checking your blood sugar (glucose) after you have not eaten for a while (fasting). You may have this done every 1-3 years.  Mammogram. This may be done every 1-2  years. Talk with your health care provider about how often you should have regular mammograms.  BRCA-related cancer screening. This may be done if you have a family history of breast, ovarian, tubal, or peritoneal cancers. Other tests  Sexually transmitted disease (STD) testing.  Bone density scan. This is done to screen for osteoporosis. You may have this done starting at age 33. Follow these instructions at home: Eating and drinking  Eat a diet that includes fresh fruits and vegetables, whole grains, lean protein, and low-fat dairy products. Limit your intake of foods with high amounts of sugar, saturated fats, and salt.  Take vitamin and mineral supplements as recommended by your health care provider.  Do not drink alcohol if your health care provider tells you not to drink.  If you drink alcohol: ? Limit how much you have to 0-1 drink a day. ? Be aware of how much alcohol is in your drink. In the [place] one drink equals one 12 oz bottle of beer (355 mL), one 5 oz glass of wine (148 mL), or one 1 oz glass of hard liquor  (44 mL). Lifestyle  Take daily care of your teeth and gums.  Stay active. Exercise for at least 30 minutes on 5 or more days each week.  Do not use any products that contain nicotine or tobacco, such as cigarettes, e-cigarettes, and chewing tobacco. If you need help quitting, ask your health care provider.  If you are sexually active, practice safe sex. Use a condom or other form of protection in order to prevent STIs (sexually transmitted infections).  Talk with your health care provider about taking a low-dose aspirin or statin. What's next?  Go to your health care provider once a year for a well check visit.  Ask your health care provider how often you should have your eyes and teeth checked.  Stay up to date on all vaccines. This information is not intended to replace advice given to you by your health care provider. Make sure you discuss any questions you have with your health care provider. Document Revised: 04/19/2018 Document Reviewed: 04/19/2018 Elsevier Patient Education  2020 [place], MD [facility]

## 2019-12-17 NOTE — Patient Instructions (Signed)
-Nice seeing you today!!  -Lab work today; will notify you once results are available.  -See you back in 6 months.   Preventive Care 65 Years and Older, Female Preventive care refers to lifestyle choices and visits with your health care provider that can promote health and wellness. This includes:  A yearly physical exam. This is also called an annual well check.  Regular dental and eye exams.  Immunizations.  Screening for certain conditions.  Healthy lifestyle choices, such as diet and exercise. What can I expect for my preventive care visit? Physical exam Your health care provider will check:  Height and weight. These may be used to calculate body mass index (BMI), which is a measurement that tells if you are at a healthy weight.  Heart rate and blood pressure.  Your skin for abnormal spots. Counseling Your health care provider may ask you questions about:  Alcohol, tobacco, and drug use.  Emotional well-being.  Home and relationship well-being.  Sexual activity.  Eating habits.  History of falls.  Memory and ability to understand (cognition).  Work and work environment.  Pregnancy and menstrual history. What immunizations do I need?  Influenza (flu) vaccine  This is recommended every year. Tetanus, diphtheria, and pertussis (Tdap) vaccine  You may need a Td booster every 10 years. Varicella (chickenpox) vaccine  You may need this vaccine if you have not already been vaccinated. Zoster (shingles) vaccine  You may need this after age 60. Pneumococcal conjugate (PCV13) vaccine  One dose is recommended after age 65. Pneumococcal polysaccharide (PPSV23) vaccine  One dose is recommended after age 65. Measles, mumps, and rubella (MMR) vaccine  You may need at least one dose of MMR if you were born in 1957 or later. You may also need a second dose. Meningococcal conjugate (MenACWY) vaccine  You may need this if you have certain  conditions. Hepatitis A vaccine  You may need this if you have certain conditions or if you travel or work in places where you may be exposed to hepatitis A. Hepatitis B vaccine  You may need this if you have certain conditions or if you travel or work in places where you may be exposed to hepatitis B. Haemophilus influenzae type b (Hib) vaccine  You may need this if you have certain conditions. You may receive vaccines as individual doses or as more than one vaccine together in one shot (combination vaccines). Talk with your health care provider about the risks and benefits of combination vaccines. What tests do I need? Blood tests  Lipid and cholesterol levels. These may be checked every 5 years, or more frequently depending on your overall health.  Hepatitis C test.  Hepatitis B test. Screening  Lung cancer screening. You may have this screening every year starting at age 55 if you have a 30-pack-year history of smoking and currently smoke or have quit within the past 15 years.  Colorectal cancer screening. All adults should have this screening starting at age 50 and continuing until age 75. Your health care provider may recommend screening at age 45 if you are at increased risk. You will have tests every 1-10 years, depending on your results and the type of screening test.  Diabetes screening. This is done by checking your blood sugar (glucose) after you have not eaten for a while (fasting). You may have this done every 1-3 years.  Mammogram. This may be done every 1-2 years. Talk with your health care provider about how often you should   have regular mammograms.  BRCA-related cancer screening. This may be done if you have a family history of breast, ovarian, tubal, or peritoneal cancers. Other tests  Sexually transmitted disease (STD) testing.  Bone density scan. This is done to screen for osteoporosis. You may have this done starting at age 81. Follow these instructions at  home: Eating and drinking  Eat a diet that includes fresh fruits and vegetables, whole grains, lean protein, and low-fat dairy products. Limit your intake of foods with high amounts of sugar, saturated fats, and salt.  Take vitamin and mineral supplements as recommended by your health care provider.  Do not drink alcohol if your health care provider tells you not to drink.  If you drink alcohol: ? Limit how much you have to 0-1 drink a day. ? Be aware of how much alcohol is in your drink. In the [place] one drink equals one 12 oz bottle of beer (355 mL), one 5 oz glass of wine (148 mL), or one 1 oz glass of hard liquor (44 mL). Lifestyle  Take daily care of your teeth and gums.  Stay active. Exercise for at least 30 minutes on 5 or more days each week.  Do not use any products that contain nicotine or tobacco, such as cigarettes, e-cigarettes, and chewing tobacco. If you need help quitting, ask your health care provider.  If you are sexually active, practice safe sex. Use a condom or other form of protection in order to prevent STIs (sexually transmitted infections).  Talk with your health care provider about taking a low-dose aspirin or statin. What's next?  Go to your health care provider once a year for a well check visit.  Ask your health care provider how often you should have your eyes and teeth checked.  Stay up to date on all vaccines. This information is not intended to replace advice given to you by your health care provider. Make sure you discuss any questions you have with your health care provider. Document Revised: 04/19/2018 Document Reviewed: 04/19/2018 Elsevier Patient Education  2020 [employer].

## 2019-12-18 ENCOUNTER — Encounter: Payer: Self-pay | Admitting: Internal Medicine

## 2019-12-18 ENCOUNTER — Other Ambulatory Visit: Payer: Self-pay | Admitting: Internal Medicine

## 2019-12-18 DIAGNOSIS — E559 Vitamin D deficiency, unspecified: Secondary | ICD-10-CM

## 2019-12-18 LAB — CBC WITH DIFFERENTIAL/PLATELET
Absolute Monocytes: 383 cells/uL (ref 200–950)
Basophils Absolute: 38 cells/uL (ref 0–200)
Basophils Relative: 0.7 %
Eosinophils Absolute: 103 cells/uL (ref 15–500)
Eosinophils Relative: 1.9 %
HCT: 39.8 % (ref 35.0–45.0)
Hemoglobin: 13.1 g/dL (ref 11.7–15.5)
Lymphs Abs: 2279 cells/uL (ref 850–3900)
MCH: 29.6 pg (ref 27.0–33.0)
MCHC: 32.9 g/dL (ref 32.0–36.0)
MCV: 90 fL (ref 80.0–100.0)
MPV: 9.5 fL (ref 7.5–12.5)
Monocytes Relative: 7.1 %
Neutro Abs: 2597 cells/uL (ref 1500–7800)
Neutrophils Relative %: 48.1 %
Platelets: 315 10*3/uL (ref 140–400)
RBC: 4.42 10*6/uL (ref 3.80–5.10)
RDW: 13.5 % (ref 11.0–15.0)
Total Lymphocyte: 42.2 %
WBC: 5.4 10*3/uL (ref 3.8–10.8)

## 2019-12-18 LAB — LIPID PANEL
Cholesterol: 161 mg/dL (ref <200)
HDL: 39 mg/dL — ABNORMAL LOW (ref >=50)
LDL Cholesterol (Calc): 104 mg/dL (calc) — ABNORMAL HIGH
Non-HDL Cholesterol (Calc): 122 mg/dL (calc) (ref <130)
Total CHOL/HDL Ratio: 4.1 (calc) (ref <5.0)
Triglycerides: 87 mg/dL (ref <150)

## 2019-12-18 LAB — VITAMIN B12: Vitamin B-12: 1681 pg/mL — ABNORMAL HIGH (ref 200–1100)

## 2019-12-18 LAB — COMPREHENSIVE METABOLIC PANEL
AG Ratio: 1.8 (calc) (ref 1.0–2.5)
ALT: 22 U/L (ref 6–29)
AST: 15 U/L (ref 10–35)
Albumin: 4.6 g/dL (ref 3.6–5.1)
Alkaline phosphatase (APISO): 81 U/L (ref 37–153)
BUN: 12 mg/dL (ref 7–25)
CO2: 30 mmol/L (ref 20–32)
Calcium: 9.9 mg/dL (ref 8.6–10.4)
Chloride: 102 mmol/L (ref 98–110)
Creat: 0.76 mg/dL (ref 0.50–0.99)
Globulin: 2.6 g/dL (calc) (ref 1.9–3.7)
Glucose, Bld: 121 mg/dL — ABNORMAL HIGH (ref 65–99)
Potassium: 4.4 mmol/L (ref 3.5–5.3)
Sodium: 143 mmol/L (ref 135–146)
Total Bilirubin: 0.8 mg/dL (ref 0.2–1.2)
Total Protein: 7.2 g/dL (ref 6.1–8.1)

## 2019-12-18 LAB — TSH: TSH: 0.8 mIU/L (ref 0.40–4.50)

## 2019-12-18 LAB — VITAMIN D 25 HYDROXY (VIT D DEFICIENCY, FRACTURES): Vit D, 25-Hydroxy: 32 ng/mL (ref 30–100)

## 2019-12-18 LAB — HEMOGLOBIN A1C
Hgb A1c MFr Bld: 6.4 % of total Hgb — ABNORMAL HIGH (ref <5.7)
Mean Plasma Glucose: 137 (calc)
eAG (mmol/L): 7.6 (calc)

## 2019-12-18 MED ORDER — VITAMIN D (ERGOCALCIFEROL) 1.25 MG (50000 UNIT) PO CAPS: 50000.0000 [IU] | ORAL_CAPSULE | ORAL | 0 refills | Status: AC

## 2019-12-19 ENCOUNTER — Other Ambulatory Visit: Payer: Self-pay | Admitting: Internal Medicine

## 2019-12-19 DIAGNOSIS — E559 Vitamin D deficiency, unspecified: Secondary | ICD-10-CM

## 2020-01-05 ENCOUNTER — Other Ambulatory Visit: Payer: Self-pay | Admitting: Internal Medicine

## 2020-01-05 DIAGNOSIS — I10 Essential (primary) hypertension: Secondary | ICD-10-CM

## 2020-01-28 ENCOUNTER — Other Ambulatory Visit: Payer: Self-pay

## 2020-01-28 ENCOUNTER — Ambulatory Visit (INDEPENDENT_AMBULATORY_CARE_PROVIDER_SITE_OTHER): Payer: PPO | Admitting: *Deleted

## 2020-01-28 DIAGNOSIS — Z23 Encounter for immunization: Secondary | ICD-10-CM | POA: Diagnosis not present

## 2020-02-25 ENCOUNTER — Other Ambulatory Visit: Payer: Self-pay | Admitting: Internal Medicine

## 2020-02-25 DIAGNOSIS — E785 Hyperlipidemia, unspecified: Secondary | ICD-10-CM

## 2020-03-04 DIAGNOSIS — H35373 Puckering of macula, bilateral: Secondary | ICD-10-CM | POA: Diagnosis not present

## 2020-03-04 DIAGNOSIS — H1045 Other chronic allergic conjunctivitis: Secondary | ICD-10-CM | POA: Diagnosis not present

## 2020-03-04 DIAGNOSIS — H16223 Keratoconjunctivitis sicca, not specified as Sjogren's, bilateral: Secondary | ICD-10-CM | POA: Diagnosis not present

## 2020-03-04 DIAGNOSIS — H524 Presbyopia: Secondary | ICD-10-CM | POA: Diagnosis not present

## 2020-03-06 ENCOUNTER — Other Ambulatory Visit: Payer: Self-pay | Admitting: Internal Medicine

## 2020-03-06 DIAGNOSIS — E559 Vitamin D deficiency, unspecified: Secondary | ICD-10-CM

## 2020-06-18 ENCOUNTER — Encounter: Payer: Self-pay | Admitting: Internal Medicine

## 2020-06-18 ENCOUNTER — Other Ambulatory Visit: Payer: Self-pay

## 2020-06-18 ENCOUNTER — Ambulatory Visit (INDEPENDENT_AMBULATORY_CARE_PROVIDER_SITE_OTHER): Payer: Medicare Other | Admitting: Internal Medicine

## 2020-06-18 VITALS — BP 124/80 | HR 69 | Temp 97.7°F | Wt 190.6 lb

## 2020-06-18 DIAGNOSIS — I10 Essential (primary) hypertension: Secondary | ICD-10-CM | POA: Diagnosis not present

## 2020-06-18 DIAGNOSIS — E785 Hyperlipidemia, unspecified: Secondary | ICD-10-CM

## 2020-06-18 DIAGNOSIS — R7302 Impaired glucose tolerance (oral): Secondary | ICD-10-CM

## 2020-06-18 DIAGNOSIS — E559 Vitamin D deficiency, unspecified: Secondary | ICD-10-CM | POA: Diagnosis not present

## 2020-06-18 LAB — POCT GLYCOSYLATED HEMOGLOBIN (HGB A1C): Hemoglobin A1C: 6.1 % — AB (ref 4.0–5.6)

## 2020-06-18 LAB — VITAMIN D 25 HYDROXY (VIT D DEFICIENCY, FRACTURES): VITD: 45.45 ng/mL (ref 30.00–100.00)

## 2020-06-18 NOTE — Progress Notes (Signed)
Established Patient Office Visit     This visit occurred during the SARS-CoV-2 public health emergency.  Safety protocols were in place, including screening questions prior to the visit, additional usage of staff PPE, and extensive cleaning of exam room while observing appropriate contact time as indicated for disinfecting solutions.    CC/Reason for Visit: 56-month follow-up chronic medical conditions  HPI: Danielle Morse is a 70 y.o. female who is coming in today for the above mentioned reasons. Past Medical History is significant for: Impaired glucose tolerance, well-controlled hypertension, hyperlipidemia and vitamin D deficiency.  She has completed 12 weeks of high-dose vitamin D supplementation and needs to have levels rechecked today.  She has no acute complaints and has been doing well since I last saw her.   Past Medical/Surgical History: Past Medical History:  Diagnosis Date  . Anxiety   . Depression   . DVT (deep venous thrombosis) ([place])    History over 30 yrs ago, no further problems  . HYPERLIPIDEMIA 06/05/2008  . MENOPAUSAL SYNDROME 06/05/2008    Past Surgical History:  Procedure Laterality Date  . ABDOMINAL HYSTERECTOMY    . CESAREAN SECTION     x 2  . COLONOSCOPY  2005  . CYSTECTOMY     left elbow  . LAPAROSCOPIC OVARIAN CYSTECTOMY      Social History:  reports that she has never smoked. She has never used smokeless tobacco. She reports that she does not drink alcohol and does not use drugs.  Allergies: No Known Allergies  Family History:  Family History  Problem Relation Age of Onset  . Heart disease Father   . Cancer Sister   . Colon cancer Neg Hx      Current Outpatient Medications:  .  atorvastatin (LIPITOR) 20 MG tablet, TAKE 1 TABLET BY MOUTH EVERY DAY, Disp: 90 tablet, Rfl: 1 .  cholecalciferol (VITAMIN D) 1000 UNITS tablet, Take 1,000 Units by mouth daily., Disp: , Rfl:  .  hydrochlorothiazide (HYDRODIURIL) 25 MG tablet, TAKE 1  TABLET BY MOUTH EVERY DAY, Disp: 90 tablet, Rfl: 1 .  Multiple Vitamin (MULTIVITAMIN) tablet, Take 1 tablet by mouth daily., Disp: , Rfl:   Review of Systems:  Constitutional: Denies fever, chills, diaphoresis, appetite change and fatigue.  HEENT: Denies photophobia, eye pain, redness, hearing loss, ear pain, congestion, sore throat, rhinorrhea, sneezing, mouth sores, trouble swallowing, neck pain, neck stiffness and tinnitus.   Respiratory: Denies SOB, DOE, cough, chest tightness,  and wheezing.   Cardiovascular: Denies chest pain, palpitations and leg swelling.  Gastrointestinal: Denies nausea, vomiting, abdominal pain, diarrhea, constipation, blood in stool and abdominal distention.  Genitourinary: Denies dysuria, urgency, frequency, hematuria, flank pain and difficulty urinating.  Endocrine: Denies: hot or cold intolerance, sweats, changes in hair or nails, polyuria, polydipsia. Musculoskeletal: Denies myalgias, back pain, joint swelling, arthralgias and gait problem.  Skin: Denies pallor, rash and wound.  Neurological: Denies dizziness, seizures, syncope, weakness, light-headedness, numbness and headaches.  Hematological: Denies adenopathy. Easy bruising, personal or family bleeding history  Psychiatric/Behavioral: Denies suicidal ideation, mood changes, confusion, nervousness, sleep disturbance and agitation    Physical Exam: Vitals:   06/18/20 0828  BP: 124/80  Pulse: 69  Temp: 97.7 F (36.5 C)  TempSrc: Oral  SpO2: 98%  Weight: 190 lb 9.6 oz (86.5 kg)    Body mass index is 30.3 kg/m.   Constitutional: NAD, calm, comfortable Eyes: PERRL, lids and conjunctivae normal, wears corrective lenses ENMT: Mucous membranes are moist. Respiratory: clear to  auscultation bilaterally, no wheezing, no crackles. Normal respiratory effort. No accessory muscle use.  Cardiovascular: Regular rate and rhythm, no murmurs / rubs / gallops. No extremity edema.  Neurologic: Grossly intact and  nonfocal. Psychiatric: Normal judgment and insight. Alert and oriented x 3. Normal mood.    Impression and Plan:  Impaired glucose tolerance -A1c today has improved to 6.1, continue healthy lifestyle changes.  Essential hypertension -Well-controlled on hydrochlorothiazide.  Dyslipidemia -LDL was 104 in August 2021, continue atorvastatin.  Vitamin D deficiency  - Plan: VITAMIN D 25 Hydroxy (Vit-D Deficiency, Fractures     Bianney Rockwood Isaac Bliss, MD [facility]

## 2020-06-22 ENCOUNTER — Telehealth: Payer: Self-pay | Admitting: Internal Medicine

## 2020-06-22 NOTE — Telephone Encounter (Signed)
Patient called for results °

## 2020-06-24 NOTE — Telephone Encounter (Signed)
Patient is aware 

## 2020-06-24 NOTE — Telephone Encounter (Signed)
Vit D looks good. I would continue 2000 IU daily OTC

## 2020-06-29 ENCOUNTER — Other Ambulatory Visit: Payer: Self-pay | Admitting: Internal Medicine

## 2020-06-29 DIAGNOSIS — I10 Essential (primary) hypertension: Secondary | ICD-10-CM

## 2020-08-14 ENCOUNTER — Other Ambulatory Visit: Payer: Self-pay | Admitting: Internal Medicine

## 2020-08-14 DIAGNOSIS — E785 Hyperlipidemia, unspecified: Secondary | ICD-10-CM

## 2020-08-31 ENCOUNTER — Other Ambulatory Visit: Payer: Self-pay | Admitting: Internal Medicine

## 2020-08-31 DIAGNOSIS — Z1231 Encounter for screening mammogram for malignant neoplasm of breast: Secondary | ICD-10-CM

## 2020-11-05 ENCOUNTER — Other Ambulatory Visit: Payer: Self-pay

## 2020-11-05 ENCOUNTER — Ambulatory Visit
Admission: RE | Admit: 2020-11-05 | Discharge: 2020-11-05 | Disposition: A | Discharge status: Home / Self Care | Payer: Medicare Other | Source: Ambulatory Visit | Attending: Internal Medicine | Admitting: Internal Medicine

## 2020-11-05 DIAGNOSIS — Z1231 Encounter for screening mammogram for malignant neoplasm of breast: Secondary | ICD-10-CM | POA: Diagnosis not present

## 2020-12-14 DIAGNOSIS — M5136 Other intervertebral disc degeneration, lumbar region: Secondary | ICD-10-CM | POA: Diagnosis not present

## 2020-12-14 DIAGNOSIS — M9904 Segmental and somatic dysfunction of sacral region: Secondary | ICD-10-CM | POA: Diagnosis not present

## 2020-12-14 DIAGNOSIS — M9905 Segmental and somatic dysfunction of pelvic region: Secondary | ICD-10-CM | POA: Diagnosis not present

## 2020-12-14 DIAGNOSIS — M9903 Segmental and somatic dysfunction of lumbar region: Secondary | ICD-10-CM | POA: Diagnosis not present

## 2020-12-17 ENCOUNTER — Encounter: Payer: Medicare Other | Admitting: Internal Medicine

## 2020-12-28 DIAGNOSIS — M5136 Other intervertebral disc degeneration, lumbar region: Secondary | ICD-10-CM | POA: Diagnosis not present

## 2020-12-28 DIAGNOSIS — M9903 Segmental and somatic dysfunction of lumbar region: Secondary | ICD-10-CM | POA: Diagnosis not present

## 2020-12-28 DIAGNOSIS — M9904 Segmental and somatic dysfunction of sacral region: Secondary | ICD-10-CM | POA: Diagnosis not present

## 2020-12-28 DIAGNOSIS — M9905 Segmental and somatic dysfunction of pelvic region: Secondary | ICD-10-CM | POA: Diagnosis not present

## 2020-12-29 ENCOUNTER — Other Ambulatory Visit: Payer: Self-pay | Admitting: Internal Medicine

## 2020-12-29 DIAGNOSIS — I10 Essential (primary) hypertension: Secondary | ICD-10-CM

## 2021-01-05 DIAGNOSIS — M5136 Other intervertebral disc degeneration, lumbar region: Secondary | ICD-10-CM | POA: Diagnosis not present

## 2021-01-05 DIAGNOSIS — M9905 Segmental and somatic dysfunction of pelvic region: Secondary | ICD-10-CM | POA: Diagnosis not present

## 2021-01-05 DIAGNOSIS — M9904 Segmental and somatic dysfunction of sacral region: Secondary | ICD-10-CM | POA: Diagnosis not present

## 2021-01-05 DIAGNOSIS — M9903 Segmental and somatic dysfunction of lumbar region: Secondary | ICD-10-CM | POA: Diagnosis not present

## 2021-01-12 DIAGNOSIS — M9903 Segmental and somatic dysfunction of lumbar region: Secondary | ICD-10-CM | POA: Diagnosis not present

## 2021-01-12 DIAGNOSIS — M5136 Other intervertebral disc degeneration, lumbar region: Secondary | ICD-10-CM | POA: Diagnosis not present

## 2021-01-12 DIAGNOSIS — M9904 Segmental and somatic dysfunction of sacral region: Secondary | ICD-10-CM | POA: Diagnosis not present

## 2021-01-12 DIAGNOSIS — M9905 Segmental and somatic dysfunction of pelvic region: Secondary | ICD-10-CM | POA: Diagnosis not present

## 2021-02-03 ENCOUNTER — Other Ambulatory Visit: Payer: Self-pay

## 2021-02-04 ENCOUNTER — Other Ambulatory Visit (HOSPITAL_COMMUNITY)
Admission: RE | Admit: 2021-02-04 | Discharge: 2021-02-04 | Disposition: A | Discharge status: Home / Self Care | Payer: Medicare Other | Source: Ambulatory Visit | Attending: Internal Medicine | Admitting: Internal Medicine

## 2021-02-04 ENCOUNTER — Ambulatory Visit (INDEPENDENT_AMBULATORY_CARE_PROVIDER_SITE_OTHER): Payer: Medicare Other | Admitting: Internal Medicine

## 2021-02-04 ENCOUNTER — Encounter: Payer: Self-pay | Admitting: Internal Medicine

## 2021-02-04 VITALS — Temp 98.2°F | Ht 67.0 in | Wt 185.7 lb

## 2021-02-04 DIAGNOSIS — I10 Essential (primary) hypertension: Secondary | ICD-10-CM | POA: Diagnosis not present

## 2021-02-04 DIAGNOSIS — Z23 Encounter for immunization: Secondary | ICD-10-CM | POA: Diagnosis not present

## 2021-02-04 DIAGNOSIS — Z0000 Encounter for general adult medical examination without abnormal findings: Secondary | ICD-10-CM | POA: Diagnosis not present

## 2021-02-04 DIAGNOSIS — Z1151 Encounter for screening for human papillomavirus (HPV): Secondary | ICD-10-CM | POA: Insufficient documentation

## 2021-02-04 DIAGNOSIS — D229 Melanocytic nevi, unspecified: Secondary | ICD-10-CM

## 2021-02-04 DIAGNOSIS — E785 Hyperlipidemia, unspecified: Secondary | ICD-10-CM

## 2021-02-04 DIAGNOSIS — R7302 Impaired glucose tolerance (oral): Secondary | ICD-10-CM | POA: Diagnosis not present

## 2021-02-04 DIAGNOSIS — E559 Vitamin D deficiency, unspecified: Secondary | ICD-10-CM

## 2021-02-04 DIAGNOSIS — Z01419 Encounter for gynecological examination (general) (routine) without abnormal findings: Secondary | ICD-10-CM | POA: Diagnosis present

## 2021-02-04 DIAGNOSIS — Z124 Encounter for screening for malignant neoplasm of cervix: Secondary | ICD-10-CM | POA: Insufficient documentation

## 2021-02-04 DIAGNOSIS — Z13820 Encounter for screening for osteoporosis: Secondary | ICD-10-CM

## 2021-02-04 DIAGNOSIS — Z780 Asymptomatic menopausal state: Secondary | ICD-10-CM

## 2021-02-04 DIAGNOSIS — H9193 Unspecified hearing loss, bilateral: Secondary | ICD-10-CM

## 2021-02-04 LAB — CBC WITH DIFFERENTIAL/PLATELET
Basophils Absolute: 0 10*3/uL (ref 0.0–0.1)
Basophils Relative: 0.7 % (ref 0.0–3.0)
Eosinophils Absolute: 0.1 10*3/uL (ref 0.0–0.7)
Eosinophils Relative: 1.9 % (ref 0.0–5.0)
HCT: 40.3 % (ref 36.0–46.0)
Hemoglobin: 13.5 g/dL (ref 12.0–15.0)
Lymphocytes Relative: 34.1 % (ref 12.0–46.0)
Lymphs Abs: 2.2 10*3/uL (ref 0.7–4.0)
MCHC: 33.6 g/dL (ref 30.0–36.0)
MCV: 90.1 fl (ref 78.0–100.0)
Monocytes Absolute: 0.4 10*3/uL (ref 0.1–1.0)
Monocytes Relative: 6.1 % (ref 3.0–12.0)
Neutro Abs: 3.7 10*3/uL (ref 1.4–7.7)
Neutrophils Relative %: 57.2 % (ref 43.0–77.0)
Platelets: 334 10*3/uL (ref 150.0–400.0)
RBC: 4.47 Mil/uL (ref 3.87–5.11)
RDW: 14.2 % (ref 11.5–15.5)
WBC: 6.6 10*3/uL (ref 4.0–10.5)

## 2021-02-04 LAB — LIPID PANEL
Cholesterol: 164 mg/dL (ref 0–200)
HDL: 36.5 mg/dL — ABNORMAL LOW (ref >39.00)
LDL Cholesterol: 97 mg/dL (ref 0–99)
NonHDL: 127.13
Total CHOL/HDL Ratio: 4
Triglycerides: 152 mg/dL — ABNORMAL HIGH (ref 0.0–149.0)
VLDL: 30.4 mg/dL (ref 0.0–40.0)

## 2021-02-04 LAB — VITAMIN B12: Vitamin B-12: 1413 pg/mL — ABNORMAL HIGH (ref 211–911)

## 2021-02-04 LAB — TSH: TSH: 0.95 u[IU]/mL (ref 0.35–5.50)

## 2021-02-04 LAB — COMPREHENSIVE METABOLIC PANEL
ALT: 18 U/L (ref 0–35)
AST: 12 U/L (ref 0–37)
Albumin: 4.7 g/dL (ref 3.5–5.2)
Alkaline Phosphatase: 89 U/L (ref 39–117)
BUN: 9 mg/dL (ref 6–23)
CO2: 33 mEq/L — ABNORMAL HIGH (ref 19–32)
Calcium: 9.9 mg/dL (ref 8.4–10.5)
Chloride: 100 mEq/L (ref 96–112)
Creatinine, Ser: 0.76 mg/dL (ref 0.40–1.20)
GFR: 79.79 mL/min (ref >60.00)
Glucose, Bld: 111 mg/dL — ABNORMAL HIGH (ref 70–99)
Potassium: 4.4 mEq/L (ref 3.5–5.1)
Sodium: 141 mEq/L (ref 135–145)
Total Bilirubin: 0.6 mg/dL (ref 0.2–1.2)
Total Protein: 7.4 g/dL (ref 6.0–8.3)

## 2021-02-04 LAB — HEMOGLOBIN A1C: Hgb A1c MFr Bld: 6.6 % — ABNORMAL HIGH (ref 4.6–6.5)

## 2021-02-04 LAB — VITAMIN D 25 HYDROXY (VIT D DEFICIENCY, FRACTURES): VITD: 45.57 ng/mL (ref 30.00–100.00)

## 2021-02-04 NOTE — Progress Notes (Signed)
Established Patient Office Visit     This visit occurred during the SARS-CoV-2 public health emergency.  Safety protocols were in place, including screening questions prior to the visit, additional usage of staff PPE, and extensive cleaning of exam room while observing appropriate contact time as indicated for disinfecting solutions.    CC/Reason for Visit: Annual preventive exam and subsequent Medicare wellness visit  HPI: Danielle Morse is a 70 y.o. female who is coming in today for the above mentioned reasons. Past Medical History is significant for:  Impaired glucose tolerance, well-controlled hypertension, hyperlipidemia and vitamin D deficiency.  She is feeling well today.  She has had cataract surgery since I last saw her.  She has recovered well from this.  She is overdue for a bone density test.  She has routine eye and dental care.  She does not exercise routinely.  She has multiple skin moles and is requesting dermatology referral.  She has been complaining of bilateral hearing loss.  She is due for a flu vaccination.  She has not had a Pap smear in years.  She had a colonoscopy in 2016.  She had a mammogram in June of this year.   Past Medical/Surgical History: Past Medical History:  Diagnosis Date   Anxiety    Depression    DVT (deep venous thrombosis) ([place])    History over 30 yrs ago, no further problems   HYPERLIPIDEMIA 06/05/2008   MENOPAUSAL SYNDROME 06/05/2008    Past Surgical History:  Procedure Laterality Date   ABDOMINAL HYSTERECTOMY     CESAREAN SECTION     x 2   COLONOSCOPY  2005   CYSTECTOMY     left elbow   LAPAROSCOPIC OVARIAN CYSTECTOMY      Social History:  reports that she has never smoked. She has never used smokeless tobacco. She reports that she does not drink alcohol and does not use drugs.  Allergies: No Known Allergies  Family History:  Family History  Problem Relation Age of Onset   Heart disease Father    Cancer Sister     Colon cancer Neg Hx      Current Outpatient Medications:    atorvastatin (LIPITOR) 20 MG tablet, TAKE 1 TABLET BY MOUTH EVERY DAY, Disp: 90 tablet, Rfl: 1   cholecalciferol (VITAMIN D) 1000 UNITS tablet, Take 1,000 Units by mouth daily., Disp: , Rfl:    hydrochlorothiazide (HYDRODIURIL) 25 MG tablet, TAKE 1 TABLET BY MOUTH EVERY DAY, Disp: 90 tablet, Rfl: 0   Multiple Vitamin (MULTIVITAMIN) tablet, Take 1 tablet by mouth daily., Disp: , Rfl:   Review of Systems:  Constitutional: Denies fever, chills, diaphoresis, appetite change and fatigue.  HEENT: Denies photophobia, eye pain, redness, hearing loss, ear pain, congestion, sore throat, rhinorrhea, sneezing, mouth sores, trouble swallowing, neck pain, neck stiffness and tinnitus.   Respiratory: Denies SOB, DOE, cough, chest tightness,  and wheezing.   Cardiovascular: Denies chest pain, palpitations and leg swelling.  Gastrointestinal: Denies nausea, vomiting, abdominal pain, diarrhea, constipation, blood in stool and abdominal distention.  Genitourinary: Denies dysuria, urgency, frequency, hematuria, flank pain and difficulty urinating.  Endocrine: Denies: hot or cold intolerance, sweats, changes in hair or nails, polyuria, polydipsia. Musculoskeletal: Denies myalgias, back pain, joint swelling, arthralgias and gait problem.  Skin: Denies pallor, rash and wound.  Neurological: Denies dizziness, seizures, syncope, weakness, light-headedness, numbness and headaches.  Hematological: Denies adenopathy. Easy bruising, personal or family bleeding history  Psychiatric/Behavioral: Denies suicidal ideation, mood changes, confusion, nervousness,  sleep disturbance and agitation    Physical Exam: Vitals:   02/04/21 0858  Temp: 98.2 F (36.8 C)  TempSrc: Oral  Weight: 185 lb 11.2 oz (84.2 kg)  Height: 5\' 7"  (1.702 m)    Body mass index is 29.08 kg/m.   Constitutional: NAD, calm, comfortable Eyes: PERRL, lids and conjunctivae normal,  wears corrective lenses ENMT: Mucous membranes are moist. Posterior pharynx clear of any exudate or lesions. Normal dentition. Tympanic membrane is pearly white, no erythema or bulging. Neck: normal, supple, no masses, no thyromegaly Respiratory: clear to auscultation bilaterally, no wheezing, no crackles. Normal respiratory effort. No accessory muscle use.  Cardiovascular: Regular rate and rhythm, no murmurs / rubs / gallops. No extremity edema. 2+ pedal pulses. No carotid bruits.  Abdomen: no tenderness, no masses palpated. No hepatosplenomegaly. Bowel sounds positive.  Musculoskeletal: no clubbing / cyanosis. No joint deformity upper and lower extremities. Good ROM, no contractures. Normal muscle tone.  Skin: Multiple skin moles, suspect neurofibromatosis. Neurologic: CN 2-12 grossly intact. Sensation intact, DTR normal. Strength 5/5 in all 4.  Psychiatric: Normal judgment and insight. Alert and oriented x 3. Normal mood.    Subsequent Medicare wellness visit   1. Risk factors, based on past  M,S,F -cardiovascular disease risk factors include age, hypertension, hyperlipidemia.   2.  Physical activities: No formal physical activity.   3.  Depression/mood: Stable, not depressed   4.  Hearing: Decreased hearing bilaterally   5.  ADL's: Independent in all ADLs   6.  Fall risk: Low fall risk   7.  Home safety: No problems identified   8.  Height weight, and visual acuity: height and weight as above, vision:  Vision Screening   Right eye Left eye Both eyes  Without correction     With correction 20/32 20/32 20/20      9.  Counseling: Advise she update her vaccination and cancer screenings   10. Lab orders based on risk factors: Laboratory update will be reviewed   11. Referral : Cardiology, dermatology   12. Care plan: Follow-up with me in 6 months   13. Cognitive assessment: No cognitive impairment   14. Screening: Patient provided with a written and personalized 5-10 year  screening schedule in the AVS. yes   15. Provider List Update: PCP, ophthalmology  16. Advance Directives: Full code   17. Opioids: Patient is not on any opioid prescriptions and has no risk factors for a substance use disorder.   [facility] [facility] Office Visit from 12/17/2019 in [facility] [place]  PHQ-9 Total Score 2       Fall Risk  12/17/2019 12/13/2018 12/11/2017  Falls in the past year? 0 0 No  Number falls in past yr: 0 0 -  Injury with Fall? 0 0 -     Impression and Plan:  Encounter for preventive health examination -She has routine eye and dental care. -Flu vaccine today, otherwise immunizations are up-to-date. -Labs will be updated today. -Healthy lifestyle discussed in detail. -DEXA scan today for osteoporosis screening. -Pap smear performed in office today. -She had a colonoscopy in 2018 and is a 10-year callback. -She had a negative mammogram in June 2022. -Refer to dermatology for skin cancer screening. -Audiology referral today due to hearing difficulty. -During pelvic exam she was found to have an inflamed Bartholin cyst over her left labia majora.  Sits baths and warm compresses have been recommended.  Vitamin D deficiency  - Plan: VITAMIN D 25 Hydroxy (Vit-D Deficiency, Fractures)  Impaired glucose tolerance  - Plan: Hemoglobin A1c -Last A1c was 6.1 in February 2022.  Essential hypertension -Well-controlled on hydrochlorothiazide.  Dyslipidemia  - Plan: Lipid panel -Last lipid panel in August 2021 with a total cholesterol of 161, triglycerides 87 and LDL 104.  She is on atorvastatin daily.  Bilateral hearing loss, unspecified hearing loss type -Referral to audiology.  Need for influenza vaccination -Flu vaccine administered today.  Multiple atypical skin moles -I suspect she has neurofibromatosis, will send to dermatology for skin cancer screening.    Patient Instructions  -Nice seeing you today!!  -Lab work today; will notify  you once results are available.  -Flu vaccine today.  -Schedule follow up in 6 months.     [name], MD [facility]

## 2021-02-04 NOTE — Addendum Note (Signed)
Addended by: Westley Hummer B on: 02/04/2021 10:10 AM   Modules accepted: Orders

## 2021-02-04 NOTE — Patient Instructions (Signed)
-  Nice seeing you today!!  -Lab work today; will notify you once results are available.  -Flu vaccine today.  -Schedule follow up in 6 months.

## 2021-02-05 ENCOUNTER — Encounter: Payer: Self-pay | Admitting: Internal Medicine

## 2021-02-05 ENCOUNTER — Other Ambulatory Visit: Payer: Self-pay | Admitting: Internal Medicine

## 2021-02-05 DIAGNOSIS — E119 Type 2 diabetes mellitus without complications: Secondary | ICD-10-CM | POA: Insufficient documentation

## 2021-02-05 DIAGNOSIS — E1169 Type 2 diabetes mellitus with other specified complication: Secondary | ICD-10-CM

## 2021-02-05 DIAGNOSIS — E785 Hyperlipidemia, unspecified: Secondary | ICD-10-CM

## 2021-02-05 LAB — CYTOLOGY - PAP
Adequacy: ABSENT
Comment: NEGATIVE
Diagnosis: NEGATIVE
High risk HPV: NEGATIVE

## 2021-02-05 MED ORDER — METFORMIN HCL 500 MG PO TABS: 500.0000 mg | ORAL_TABLET | Freq: Every day | ORAL | 1 refills | Status: DC

## 2021-02-05 MED ORDER — ATORVASTATIN CALCIUM 40 MG PO TABS: 40.0000 mg | ORAL_TABLET | Freq: Every day | ORAL | 1 refills | Status: DC

## 2021-02-05 NOTE — Progress Notes (Signed)
1. A1c makes her a diabetic. Start Lifestyle modifications: healthy eating, weight loss, increased physical activity.  Start metformin 500 mg at bedtime. Please schedule a 3 month follow up.  2. Cholesterol is not at goal. Increase atorvastatin to 40 mg daily.  Rest of labs look good.

## 2021-02-08 ENCOUNTER — Other Ambulatory Visit: Payer: Self-pay

## 2021-02-08 ENCOUNTER — Ambulatory Visit (INDEPENDENT_AMBULATORY_CARE_PROVIDER_SITE_OTHER)
Admission: RE | Admit: 2021-02-08 | Discharge: 2021-02-08 | Disposition: A | Discharge status: Home / Self Care | Payer: Medicare Other | Source: Ambulatory Visit | Attending: Internal Medicine | Admitting: Internal Medicine

## 2021-02-08 DIAGNOSIS — Z13820 Encounter for screening for osteoporosis: Secondary | ICD-10-CM

## 2021-02-08 DIAGNOSIS — Z780 Asymptomatic menopausal state: Secondary | ICD-10-CM | POA: Diagnosis not present

## 2021-02-10 ENCOUNTER — Other Ambulatory Visit: Payer: Self-pay

## 2021-02-10 ENCOUNTER — Ambulatory Visit: Payer: Medicare Other | Attending: Audiologist | Admitting: Audiologist

## 2021-02-10 DIAGNOSIS — H9011 Conductive hearing loss, unilateral, right ear, with unrestricted hearing on the contralateral side: Secondary | ICD-10-CM | POA: Insufficient documentation

## 2021-02-10 NOTE — Procedures (Signed)
Outpatient Audiology and [facility] [address] [place], [place]  [address] [phone]  AUDIOLOGICAL  EVALUATION  NAME: Danielle Morse     DOB:   1950/11/28      MRN: [id number]                                                                                     DATE: 02/10/2021     REFERENT: Philip Aspen, Limmie Patricia, MD STATUS: Outpatient DIAGNOSIS: Mild Conductive Hearing Loss Right Ear   History: [name] was seen for an audiological evaluation.  [name] is receiving a hearing evaluation due to concerns for feeling like she is struggling to hear and that there is something in her ear. [name] said she uses Qtips regularly to get out wax, but lately feels like there is something in her ear making it hard to hear. This difficulty began gradually. No pain or pressure reported in either ear. No tinnitus present in either ear. 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 She remembers having routine evaluations at work and being told her right ear is slightly worse.  Medical history positive for diabetes which is a risk factor for hearing loss. No other relevant case history reported.   Evaluation:  Otoscopy showed a clear view of the tympanic membranes, bilaterally Tympanometry results were consistent with normal but shallow middle ear functio, bilaterally   Audiometric testing was completed using conventional audiometry with insert transducer. Speech Recognition Thresholds were consistent with pure tone averages. Word Recognition was excellent at conversation level. Pure tone thresholds show normal hearing in the left ear and a mild conductive hearing loss in the right ear.  Results:  The test results were reviewed with [name]. She has mild hearing loss in her right ear. There is a small conductive component to her hearing loss and her middle ears have shallow responses. Since she is also having chronic congestion, and  problems with her nose, recommend a consult with otolaryngology.   Recommendations: Referral to ENT Physician recommended due to patient's chronic   congestion and mild conductive hearing loss.    [name]  Audiologist, Au.D., CCC-A 02/10/2021  2:27 PM  Cc: Philip Aspen, Limmie Patricia, MD

## 2021-02-21 ENCOUNTER — Other Ambulatory Visit: Payer: Self-pay | Admitting: Internal Medicine

## 2021-02-21 DIAGNOSIS — E785 Hyperlipidemia, unspecified: Secondary | ICD-10-CM

## 2021-03-01 ENCOUNTER — Telehealth: Payer: Self-pay | Admitting: Internal Medicine

## 2021-03-01 DIAGNOSIS — R0981 Nasal congestion: Secondary | ICD-10-CM

## 2021-03-01 DIAGNOSIS — H90A11 Conductive hearing loss, unilateral, right ear with restricted hearing on the contralateral side: Secondary | ICD-10-CM

## 2021-03-01 NOTE — Telephone Encounter (Signed)
Pt has seen audiologist and now needs a referral to ENT for possible right ear blockage.

## 2021-03-02 NOTE — Telephone Encounter (Signed)
Referral placed on 03/02/21 per verbal order of MD & based on audiologist recommendations from notes on 02/10/21.

## 2021-03-02 NOTE — Addendum Note (Signed)
Addended by: Elza Rafter D on: 03/02/2021 10:54 AM   Modules accepted: Orders

## 2021-03-18 NOTE — Telephone Encounter (Signed)
Patient called to follow up on referral sent to ENT. Patient states she has not received a phone call from them. I let patient know that I did see referral in but our referral coordinator is in a meeting and that I could leave her a message and she could give a callback. Patient stated that was fine.

## 2021-03-25 ENCOUNTER — Other Ambulatory Visit: Payer: Self-pay | Admitting: Internal Medicine

## 2021-03-25 DIAGNOSIS — I10 Essential (primary) hypertension: Secondary | ICD-10-CM

## 2021-03-26 DIAGNOSIS — L821 Other seborrheic keratosis: Secondary | ICD-10-CM | POA: Diagnosis not present

## 2021-04-26 DIAGNOSIS — R0981 Nasal congestion: Secondary | ICD-10-CM | POA: Diagnosis not present

## 2021-05-11 ENCOUNTER — Ambulatory Visit: Payer: Medicare Other | Admitting: Internal Medicine

## 2021-05-13 ENCOUNTER — Ambulatory Visit (INDEPENDENT_AMBULATORY_CARE_PROVIDER_SITE_OTHER): Payer: Medicare Other | Admitting: Internal Medicine

## 2021-05-13 ENCOUNTER — Encounter: Payer: Self-pay | Admitting: Internal Medicine

## 2021-05-13 VITALS — BP 118/70 | HR 98 | Temp 98.2°F | Ht 67.0 in | Wt 163.5 lb

## 2021-05-13 DIAGNOSIS — I10 Essential (primary) hypertension: Secondary | ICD-10-CM | POA: Diagnosis not present

## 2021-05-13 DIAGNOSIS — E785 Hyperlipidemia, unspecified: Secondary | ICD-10-CM | POA: Diagnosis not present

## 2021-05-13 DIAGNOSIS — E1169 Type 2 diabetes mellitus with other specified complication: Secondary | ICD-10-CM

## 2021-05-13 LAB — LIPID PANEL
Cholesterol: 123 mg/dL (ref 0–200)
HDL: 33.6 mg/dL — ABNORMAL LOW (ref >39.00)
LDL Cholesterol: 55 mg/dL (ref 0–99)
NonHDL: 89.38
Total CHOL/HDL Ratio: 4
Triglycerides: 174 mg/dL — ABNORMAL HIGH (ref 0.0–149.0)
VLDL: 34.8 mg/dL (ref 0.0–40.0)

## 2021-05-13 LAB — COMPREHENSIVE METABOLIC PANEL
ALT: 18 U/L (ref 0–35)
AST: 12 U/L (ref 0–37)
Albumin: 4.5 g/dL (ref 3.5–5.2)
Alkaline Phosphatase: 79 U/L (ref 39–117)
BUN: 11 mg/dL (ref 6–23)
CO2: 34 mEq/L — ABNORMAL HIGH (ref 19–32)
Calcium: 9.5 mg/dL (ref 8.4–10.5)
Chloride: 100 mEq/L (ref 96–112)
Creatinine, Ser: 0.75 mg/dL (ref 0.40–1.20)
GFR: 80.91 mL/min (ref >60.00)
Glucose, Bld: 101 mg/dL — ABNORMAL HIGH (ref 70–99)
Potassium: 4.1 mEq/L (ref 3.5–5.1)
Sodium: 141 mEq/L (ref 135–145)
Total Bilirubin: 0.5 mg/dL (ref 0.2–1.2)
Total Protein: 7.3 g/dL (ref 6.0–8.3)

## 2021-05-13 LAB — MICROALBUMIN / CREATININE URINE RATIO
Creatinine,U: 80.6 mg/dL
Microalb Creat Ratio: 0.9 mg/g (ref 0.0–30.0)
Microalb, Ur: <0.7 mg/dL (ref 0.0–1.9)

## 2021-05-13 LAB — POCT GLYCOSYLATED HEMOGLOBIN (HGB A1C): HbA1c, POC (prediabetic range): 6.4 % (ref 5.7–6.4)

## 2021-05-13 NOTE — Patient Instructions (Signed)
-  Nice seeing you today!!  -Lab work today; will notify you once results are available.  -Schedule follow up in 3 months. 

## 2021-05-13 NOTE — Addendum Note (Signed)
Addended by: Rosalyn Gess D on: 05/13/2021 12:18 PM   Modules accepted: Orders

## 2021-05-13 NOTE — Progress Notes (Signed)
Established Patient Office Visit     This visit occurred during the SARS-CoV-2 public health emergency.  Safety protocols were in place, including screening questions prior to the visit, additional usage of staff PPE, and extensive cleaning of exam room while observing appropriate contact time as indicated for disinfecting solutions.    CC/Reason for Visit: 10-month follow-up chronic medical conditions  HPI: Danielle Morse is a 71 y.o. female who is coming in today for the above mentioned reasons. Past Medical History is significant for: Hypertension, hyperlipidemia, vitamin D deficiency and recently diagnosed type 2 diabetes.  At last visit she was started on metformin 500 mg at bedtime and her atorvastatin dose was increased from 20 to 40 mg daily.  She is here today to follow-up.  She has been doing well.  She has lost approximately 18 pounds since her last visit through lifestyle changes.   Past Medical/Surgical History: Past Medical History:  Diagnosis Date   Anxiety    Depression    DVT (deep venous thrombosis) ([place])    History over 30 yrs ago, no further problems   HYPERLIPIDEMIA 06/05/2008   MENOPAUSAL SYNDROME 06/05/2008    Past Surgical History:  Procedure Laterality Date   ABDOMINAL HYSTERECTOMY     CESAREAN SECTION     x 2   COLONOSCOPY  2005   CYSTECTOMY     left elbow   LAPAROSCOPIC OVARIAN CYSTECTOMY      Social History:  reports that she has never smoked. She has never used smokeless tobacco. She reports that she does not drink alcohol and does not use drugs.  Allergies: No Known Allergies  Family History:  Family History  Problem Relation Age of Onset   Heart disease Father    Cancer Sister    Colon cancer Neg Hx      Current Outpatient Medications:    atorvastatin (LIPITOR) 40 MG tablet, Take 1 tablet (40 mg total) by mouth daily., Disp: 90 tablet, Rfl: 1   cholecalciferol (VITAMIN D) 1000 UNITS tablet, Take 1,000 Units by mouth  daily., Disp: , Rfl:    hydrochlorothiazide (HYDRODIURIL) 25 MG tablet, TAKE 1 TABLET BY MOUTH EVERY DAY, Disp: 90 tablet, Rfl: 1   metFORMIN (GLUCOPHAGE) 500 MG tablet, Take 1 tablet (500 mg total) by mouth at bedtime., Disp: 90 tablet, Rfl: 1   Multiple Vitamin (MULTIVITAMIN) tablet, Take 1 tablet by mouth daily., Disp: , Rfl:   Review of Systems:  Constitutional: Denies fever, chills, diaphoresis, appetite change and fatigue.  HEENT: Denies photophobia, eye pain, redness, hearing loss, ear pain, congestion, sore throat, rhinorrhea, sneezing, mouth sores, trouble swallowing, neck pain, neck stiffness and tinnitus.   Respiratory: Denies SOB, DOE, cough, chest tightness,  and wheezing.   Cardiovascular: Denies chest pain, palpitations and leg swelling.  Gastrointestinal: Denies nausea, vomiting, abdominal pain, diarrhea, constipation, blood in stool and abdominal distention.  Genitourinary: Denies dysuria, urgency, frequency, hematuria, flank pain and difficulty urinating.  Endocrine: Denies: hot or cold intolerance, sweats, changes in hair or nails, polyuria, polydipsia. Musculoskeletal: Denies myalgias, back pain, joint swelling, arthralgias and gait problem.  Skin: Denies pallor, rash and wound.  Neurological: Denies dizziness, seizures, syncope, weakness, light-headedness, numbness and headaches.  Hematological: Denies adenopathy. Easy bruising, personal or family bleeding history  Psychiatric/Behavioral: Denies suicidal ideation, mood changes, confusion, nervousness, sleep disturbance and agitation    Physical Exam: Vitals:   05/13/21 1121  BP: 118/70  Pulse: 98  Temp: 98.2 F (36.8 C)  TempSrc: Oral  SpO2: 97%  Weight: 163 lb 8 oz (74.2 kg)  Height: 5\' 7"  (1.702 m)    Body mass index is 25.61 kg/m.   Constitutional: NAD, calm, comfortable Eyes: PERRL, lids and conjunctivae normal ENMT: Mucous membranes are moist.  Respiratory: clear to auscultation bilaterally, no  wheezing, no crackles. Normal respiratory effort. No accessory muscle use.  Cardiovascular: Regular rate and rhythm, no murmurs / rubs / gallops. No extremity edema.  Neurologic: Grossly intact and nonfocal Psychiatric: Normal judgment and insight. Alert and oriented x 3. Normal mood.    Impression and Plan:  Type 2 diabetes mellitus with other specified complication, without long-term current use of insulin ([place])  - Plan: POC HgB A1c, Microalbumin / creatinine urine ratio, Lipid panel, Comprehensive metabolic panel -In office A1c shows improvement at 6.4 today, continue metformin. -Check microalbumin.  Essential hypertension -Well-controlled.  Hyperlipidemia associated with type 2 diabetes mellitus ([facility]) -Currently on atorvastatin 40 mg daily, check lipids today.  Time spent: 31 minutes reviewing chart, interviewing and examining patient and formulating plan of care.   Patient Instructions  -Nice seeing you today!!  -Lab work today; will notify you once results are available.  -Schedule follow up in 3 months.    [name], MD [facility]

## 2021-05-28 DIAGNOSIS — H903 Sensorineural hearing loss, bilateral: Secondary | ICD-10-CM | POA: Diagnosis not present

## 2021-05-28 DIAGNOSIS — R0981 Nasal congestion: Secondary | ICD-10-CM | POA: Diagnosis not present

## 2021-08-01 ENCOUNTER — Other Ambulatory Visit: Payer: Self-pay | Admitting: Internal Medicine

## 2021-08-01 DIAGNOSIS — E785 Hyperlipidemia, unspecified: Secondary | ICD-10-CM

## 2021-08-01 DIAGNOSIS — E1169 Type 2 diabetes mellitus with other specified complication: Secondary | ICD-10-CM

## 2021-08-04 ENCOUNTER — Ambulatory Visit: Payer: Medicare Other | Admitting: Internal Medicine

## 2021-08-06 ENCOUNTER — Other Ambulatory Visit: Payer: Self-pay | Admitting: Internal Medicine

## 2021-08-06 DIAGNOSIS — Z1231 Encounter for screening mammogram for malignant neoplasm of breast: Secondary | ICD-10-CM

## 2021-08-11 ENCOUNTER — Ambulatory Visit (INDEPENDENT_AMBULATORY_CARE_PROVIDER_SITE_OTHER): Payer: Medicare Other | Admitting: Internal Medicine

## 2021-08-11 ENCOUNTER — Encounter: Payer: Self-pay | Admitting: Internal Medicine

## 2021-08-11 VITALS — BP 130/80 | HR 91 | Temp 98.1°F | Ht 67.0 in | Wt 173.9 lb

## 2021-08-11 DIAGNOSIS — E785 Hyperlipidemia, unspecified: Secondary | ICD-10-CM

## 2021-08-11 DIAGNOSIS — I10 Essential (primary) hypertension: Secondary | ICD-10-CM

## 2021-08-11 DIAGNOSIS — E1169 Type 2 diabetes mellitus with other specified complication: Secondary | ICD-10-CM | POA: Diagnosis not present

## 2021-08-11 DIAGNOSIS — E1142 Type 2 diabetes mellitus with diabetic polyneuropathy: Secondary | ICD-10-CM

## 2021-08-11 NOTE — Progress Notes (Signed)
? ? ? ?Established Patient Office Visit ? ? ? ? ?This visit occurred during the SARS-CoV-2 public health emergency.  Safety protocols were in place, including screening questions prior to the visit, additional usage of staff PPE, and extensive cleaning of exam room while observing appropriate contact time as indicated for disinfecting solutions.  ? ? ?CC/Reason for Visit: 71-monthfollow-up chronic medical conditions ? ?HPI: Danielle Appersonis a 71y.o. female who is coming in today for the above mentioned reasons. Past Medical History is significant for: Hypertension, hyperlipidemia, vitamin D deficiency and type 2 diabetes, she appears to be developing some peripheral neuropathy.  She has numbness of toes #345 on the left.  She is otherwise doing well. ? ? ?Past Medical/Surgical History: ?Past Medical History:  ?Diagnosis Date  ? Anxiety   ? Depression   ? [place]   ? History over 30 yrs ago, no further problems  ? HYPERLIPIDEMIA 06/05/2008  ? MENOPAUSAL SYNDROME 06/05/2008  ? ? ?Past Surgical History:  ?Procedure Laterality Date  ? ABDOMINAL HYSTERECTOMY    ? CESAREAN SECTION    ? x 2  ? COLONOSCOPY  2005  ? CYSTECTOMY    ? left elbow  ? LAPAROSCOPIC OVARIAN CYSTECTOMY    ? ? ?Social History: ? reports that she has never smoked. She has never used smokeless tobacco. She reports that she does not drink alcohol and does not use drugs. ? ?Allergies: ?No Known Allergies ? ?Family History:  ?Family History  ?Problem Relation Age of Onset  ? Heart disease Father   ? Cancer Sister   ? Colon cancer Neg Hx   ? ? ? ?Current Outpatient Medications:  ?  atorvastatin (LIPITOR) 40 MG tablet, TAKE 1 TABLET BY MOUTH EVERY DAY, Disp: 90 tablet, Rfl: 1 ?  cholecalciferol (VITAMIN D) 1000 UNITS tablet, Take 1,000 Units by mouth daily., Disp: , Rfl:  ?  hydrochlorothiazide (HYDRODIURIL) 25 MG tablet, TAKE 1 TABLET BY MOUTH EVERY DAY, Disp: 90 tablet, Rfl: 1 ?  metFORMIN (GLUCOPHAGE) 500 MG tablet,  TAKE 1 TABLET BY MOUTH AT BEDTIME., Disp: 90 tablet, Rfl: 1 ?  Multiple Vitamin (MULTIVITAMIN) tablet, Take 1 tablet by mouth daily., Disp: , Rfl:  ? ?Review of Systems:  ?Constitutional: Denies fever, chills, diaphoresis, appetite change and fatigue.  ?HEENT: Denies photophobia, eye pain, redness, hearing loss, ear pain, congestion, sore throat, rhinorrhea, sneezing, mouth sores, trouble swallowing, neck pain, neck stiffness and tinnitus.   ?Respiratory: Denies SOB, DOE, cough, chest tightness,  and wheezing.   ?Cardiovascular: Denies chest pain, palpitations and leg swelling.  ?Gastrointestinal: Denies nausea, vomiting, abdominal pain, diarrhea, constipation, blood in stool and abdominal distention.  ?Genitourinary: Denies dysuria, urgency, frequency, hematuria, flank pain and difficulty urinating.  ?Endocrine: Denies: hot or cold intolerance, sweats, changes in hair or nails, polyuria, polydipsia. ?Musculoskeletal: Denies myalgias, back pain, joint swelling, arthralgias and gait problem.  ?Skin: Denies pallor, rash and wound.  ?Neurological: Denies dizziness, seizures, syncope, weakness, light-headedness, numbness and headaches.  ?Hematological: Denies adenopathy. Easy bruising, personal or family bleeding history  ?Psychiatric/Behavioral: Denies suicidal ideation, mood changes, confusion, nervousness, sleep disturbance and agitation ? ? ? ?Physical Exam: ?Vitals:  ? 08/11/21 0932  ?BP: 130/80  ?Pulse: 91  ?Temp: 98.1 ?F (36.7 ?C)  ?TempSrc: Oral  ?SpO2: 99%  ?Weight: 173 lb 14.4 oz (78.9 kg)  ?Height: '5\' 7"'$  (1.702 m)  ? ? ?Body mass index is 27.24 kg/m?. ? ? ?Constitutional: NAD, calm, comfortable ?Eyes: PERRL, lids  and conjunctivae normal, wears corrective lenses ?ENMT: Mucous membranes are moist.  ?Respiratory: clear to auscultation bilaterally, no wheezing, no crackles. Normal respiratory effort. No accessory muscle use.  ?Cardiovascular: Regular rate and rhythm, no murmurs / rubs / gallops. No extremity  edema.  ?Neurologic: Grossly intact and nonfocal ?Psychiatric: Normal judgment and insight. Alert and oriented x 3. Normal mood.  ? ? ?Impression and Plan: ? ?Type 2 diabetes mellitus with diabetic polyneuropathy, without long-term current use of insulin ([place]) ?-Well-controlled with an A1c of 6.0 in office today. ? ?Hyperlipidemia associated with type 2 diabetes mellitus ([place]) ?-At goal with an LDL of 55 in January 2023 ? ?Essential hypertension ?-Well-controlled. ? ? ? ?Time spent:31 minutes reviewing chart, interviewing and examining patient and formulating plan of care. ? ? ?Patient Instructions  ?-Nice seeing you today!! ? ?-See you back in 3-4 months. ? ? ? ?[name], MD ?[facility] at [facility] ? ? ?

## 2021-08-11 NOTE — Patient Instructions (Signed)
-  Nice seeing you today!! ? ?-See you back in 3-4 months. ?

## 2021-09-29 ENCOUNTER — Other Ambulatory Visit: Payer: Self-pay | Admitting: Internal Medicine

## 2021-09-29 DIAGNOSIS — I10 Essential (primary) hypertension: Secondary | ICD-10-CM

## 2021-11-08 ENCOUNTER — Ambulatory Visit
Admission: RE | Admit: 2021-11-08 | Discharge: 2021-11-08 | Disposition: A | Discharge status: Home / Self Care | Payer: Medicare Other | Source: Ambulatory Visit | Attending: Internal Medicine | Admitting: Internal Medicine

## 2021-11-08 DIAGNOSIS — Z1231 Encounter for screening mammogram for malignant neoplasm of breast: Secondary | ICD-10-CM | POA: Diagnosis not present

## 2021-11-10 ENCOUNTER — Encounter: Payer: Self-pay | Admitting: Internal Medicine

## 2021-11-10 ENCOUNTER — Ambulatory Visit (INDEPENDENT_AMBULATORY_CARE_PROVIDER_SITE_OTHER): Payer: Medicare Other | Admitting: Internal Medicine

## 2021-11-10 VITALS — BP 128/70 | HR 75 | Temp 98.0°F | Ht 67.0 in | Wt 171.0 lb

## 2021-11-10 DIAGNOSIS — E1169 Type 2 diabetes mellitus with other specified complication: Secondary | ICD-10-CM

## 2021-11-10 DIAGNOSIS — E785 Hyperlipidemia, unspecified: Secondary | ICD-10-CM

## 2021-11-10 DIAGNOSIS — I10 Essential (primary) hypertension: Secondary | ICD-10-CM

## 2021-11-10 DIAGNOSIS — E1142 Type 2 diabetes mellitus with diabetic polyneuropathy: Secondary | ICD-10-CM

## 2021-11-10 LAB — POCT GLYCOSYLATED HEMOGLOBIN (HGB A1C): Hemoglobin A1C: 6.1 % — AB (ref 4.0–5.6)

## 2021-11-10 NOTE — Progress Notes (Signed)
Established Patient Office Visit     CC/Reason for Visit: 65-monthfollow-up chronic medical conditions  HPI: Danielle Morse a 71y.o. female who is coming in today for the above mentioned reasons. Past Medical History is significant for: Hypertension, hyperlipidemia, vitamin D deficiency, type 2 diabetes with peripheral neuropathy.  She is doing well.  She has started exercising, has lost 4 pounds since her last visit.  No acute concerns or complaints.   Past Medical/Surgical History: Past Medical History:  Diagnosis Date   Anxiety    Depression    [place]    History over 30 yrs ago, no further problems   HYPERLIPIDEMIA 06/05/2008   MENOPAUSAL SYNDROME 06/05/2008    Past Surgical History:  Procedure Laterality Date   ABDOMINAL HYSTERECTOMY     CESAREAN SECTION     x 2   COLONOSCOPY  2005   CYSTECTOMY     left elbow   LAPAROSCOPIC OVARIAN CYSTECTOMY      Social History:  reports that she has never smoked. She has never used smokeless tobacco. She reports that she does not drink alcohol and does not use drugs.  Allergies: No Known Allergies  Family History:  Family History  Problem Relation Age of Onset   Heart disease Father    Cancer Sister    Colon cancer Neg Hx    Breast cancer Neg Hx      Current Outpatient Medications:    atorvastatin (LIPITOR) 40 MG tablet, TAKE 1 TABLET BY MOUTH EVERY DAY, Disp: 90 tablet, Rfl: 1   cholecalciferol (VITAMIN D) 1000 UNITS tablet, Take 1,000 Units by mouth daily., Disp: , Rfl:    cromolyn (OPTICROM) 4 % ophthalmic solution, 1 drop 4 (four) times daily., Disp: , Rfl:    fluticasone (FLONASE) 50 MCG/ACT nasal spray, Instill 2 puffs each nostril every night., Disp: , Rfl:    hydrochlorothiazide (HYDRODIURIL) 25 MG tablet, TAKE 1 TABLET BY MOUTH EVERY DAY, Disp: 90 tablet, Rfl: 1   metFORMIN (GLUCOPHAGE) 500 MG tablet, TAKE 1 TABLET BY MOUTH AT BEDTIME., Disp: 90 tablet, Rfl: 1    Multiple Vitamin (MULTIVITAMIN) tablet, Take 1 tablet by mouth daily., Disp: , Rfl:   Review of Systems:  Constitutional: Denies fever, chills, diaphoresis, appetite change and fatigue.  HEENT: Denies photophobia, eye pain, redness, hearing loss, ear pain, congestion, sore throat, rhinorrhea, sneezing, mouth sores, trouble swallowing, neck pain, neck stiffness and tinnitus.   Respiratory: Denies SOB, DOE, cough, chest tightness,  and wheezing.   Cardiovascular: Denies chest pain, palpitations and leg swelling.  Gastrointestinal: Denies nausea, vomiting, abdominal pain, diarrhea, constipation, blood in stool and abdominal distention.  Genitourinary: Denies dysuria, urgency, frequency, hematuria, flank pain and difficulty urinating.  Endocrine: Denies: hot or cold intolerance, sweats, changes in hair or nails, polyuria, polydipsia. Musculoskeletal: Denies myalgias, back pain, joint swelling, arthralgias and gait problem.  Skin: Denies pallor, rash and wound.  Neurological: Denies dizziness, seizures, syncope, weakness, light-headedness, numbness and headaches.  Hematological: Denies adenopathy. Easy bruising, personal or family bleeding history  Psychiatric/Behavioral: Denies suicidal ideation, mood changes, confusion, nervousness, sleep disturbance and agitation    Physical Exam: Vitals:   11/10/21 0921  BP: 128/70  Pulse: 75  Temp: 98 F (36.7 C)  TempSrc: Oral  SpO2: 98%  Weight: 171 lb (77.6 kg)  Height: '5\' 7"'$  (1.702 m)    Body mass index is 26.78 kg/m.   Constitutional: NAD, calm, comfortable Eyes: PERRL, lids and conjunctivae  normal, wears corrective lenses ENMT: Mucous membranes are moist.  Respiratory: clear to auscultation bilaterally, no wheezing, no crackles. Normal respiratory effort. No accessory muscle use.  Cardiovascular: Regular rate and rhythm, no murmurs / rubs / gallops. No extremity edema.  Psychiatric: Normal judgment and insight. Alert and oriented x 3.  Normal mood.    Impression and Plan:  Type 2 diabetes mellitus with diabetic polyneuropathy, without long-term current use of insulin ([place])  - Plan: POC HgB A1c -A1c demonstrates excellent control at 6.1, continue metformin.  Essential hypertension -Blood pressure is well controlled on hydrochlorothiazide.  Hyperlipidemia associated with type 2 diabetes mellitus (HCC) -Last LDL was at goal at 55 in January 2023, on atorvastatin 40 mg daily.    Time spent:32 minutes reviewing chart, interviewing and examining patient and formulating plan of care.   Patient Instructions         Danielle Bogie Isaac Bliss, MD [facility] at [facility]

## 2021-12-16 DIAGNOSIS — M9902 Segmental and somatic dysfunction of thoracic region: Secondary | ICD-10-CM | POA: Diagnosis not present

## 2021-12-16 DIAGNOSIS — M546 Pain in thoracic spine: Secondary | ICD-10-CM | POA: Diagnosis not present

## 2021-12-22 DIAGNOSIS — M546 Pain in thoracic spine: Secondary | ICD-10-CM | POA: Diagnosis not present

## 2021-12-22 DIAGNOSIS — M9902 Segmental and somatic dysfunction of thoracic region: Secondary | ICD-10-CM | POA: Diagnosis not present

## 2021-12-30 DIAGNOSIS — M546 Pain in thoracic spine: Secondary | ICD-10-CM | POA: Diagnosis not present

## 2021-12-30 DIAGNOSIS — M9902 Segmental and somatic dysfunction of thoracic region: Secondary | ICD-10-CM | POA: Diagnosis not present

## 2022-01-05 DIAGNOSIS — M9902 Segmental and somatic dysfunction of thoracic region: Secondary | ICD-10-CM | POA: Diagnosis not present

## 2022-01-05 DIAGNOSIS — M546 Pain in thoracic spine: Secondary | ICD-10-CM | POA: Diagnosis not present

## 2022-01-30 ENCOUNTER — Other Ambulatory Visit: Payer: Self-pay | Admitting: Internal Medicine

## 2022-01-30 DIAGNOSIS — E1169 Type 2 diabetes mellitus with other specified complication: Secondary | ICD-10-CM

## 2022-01-30 DIAGNOSIS — E785 Hyperlipidemia, unspecified: Secondary | ICD-10-CM

## 2022-02-07 ENCOUNTER — Encounter: Payer: Self-pay | Admitting: Internal Medicine

## 2022-02-07 ENCOUNTER — Ambulatory Visit (INDEPENDENT_AMBULATORY_CARE_PROVIDER_SITE_OTHER): Payer: Medicare Other | Admitting: Internal Medicine

## 2022-02-07 VITALS — BP 130/80 | HR 88 | Temp 97.8°F | Resp 18 | Ht 66.93 in | Wt 169.0 lb

## 2022-02-07 DIAGNOSIS — Z0000 Encounter for general adult medical examination without abnormal findings: Secondary | ICD-10-CM | POA: Diagnosis not present

## 2022-02-07 DIAGNOSIS — I10 Essential (primary) hypertension: Secondary | ICD-10-CM

## 2022-02-07 DIAGNOSIS — Z23 Encounter for immunization: Secondary | ICD-10-CM | POA: Diagnosis not present

## 2022-02-07 DIAGNOSIS — E559 Vitamin D deficiency, unspecified: Secondary | ICD-10-CM | POA: Diagnosis not present

## 2022-02-07 DIAGNOSIS — E1142 Type 2 diabetes mellitus with diabetic polyneuropathy: Secondary | ICD-10-CM

## 2022-02-07 DIAGNOSIS — E1169 Type 2 diabetes mellitus with other specified complication: Secondary | ICD-10-CM

## 2022-02-07 DIAGNOSIS — E785 Hyperlipidemia, unspecified: Secondary | ICD-10-CM

## 2022-02-07 NOTE — Progress Notes (Signed)
Established Patient Office Visit     CC/Reason for Visit: Annual preventive exam and subsequent Medicare wellness visit  HPI: Danielle Morse is a 71 y.o. female who is coming in today for the above mentioned reasons. Past Medical History is significant for: Hypertension, hyperlipidemia, type 2 diabetes with peripheral neuropathy and vitamin D deficiency.  She has been doing well and has no acute concerns or complaints.  She has routine eye and dental care.  She is due for flu, COVID, Tdap vaccines.  Her diabetic eye exam is scheduled for October.   Past Medical/Surgical History: Past Medical History:  Diagnosis Date   Anxiety    Depression    DVT (deep venous thrombosis) ([place])    History over 30 yrs ago, no further problems   HYPERLIPIDEMIA 06/05/2008   MENOPAUSAL SYNDROME 06/05/2008    Past Surgical History:  Procedure Laterality Date   ABDOMINAL HYSTERECTOMY     CESAREAN SECTION     x 2   COLONOSCOPY  2005   CYSTECTOMY     left elbow   LAPAROSCOPIC OVARIAN CYSTECTOMY      Social History:  reports that she has never smoked. She has never used smokeless tobacco. She reports that she does not drink alcohol and does not use drugs.  Allergies: No Known Allergies  Family History:  Family History  Problem Relation Age of Onset   Heart disease Father    Cancer Sister    Colon cancer Neg Hx    Breast cancer Neg Hx      Current Outpatient Medications:    atorvastatin (LIPITOR) 40 MG tablet, TAKE 1 TABLET BY MOUTH EVERY DAY, Disp: 90 tablet, Rfl: 1   cholecalciferol (VITAMIN D) 1000 UNITS tablet, Take 1,000 Units by mouth daily., Disp: , Rfl:    cromolyn (OPTICROM) 4 % ophthalmic solution, 1 drop 4 (four) times daily., Disp: , Rfl:    fluticasone (FLONASE) 50 MCG/ACT nasal spray, Instill 2 puffs each nostril every night., Disp: , Rfl:    hydrochlorothiazide (HYDRODIURIL) 25 MG tablet, TAKE 1 TABLET BY MOUTH EVERY DAY, Disp: 90 tablet, Rfl: 1   metFORMIN  (GLUCOPHAGE) 500 MG tablet, TAKE 1 TABLET BY MOUTH EVERYDAY AT BEDTIME, Disp: 90 tablet, Rfl: 1   Multiple Vitamin (MULTIVITAMIN) tablet, Take 1 tablet by mouth daily., Disp: , Rfl:   Review of Systems:  Constitutional: Denies fever, chills, diaphoresis, appetite change and fatigue.  HEENT: Denies photophobia, eye pain, redness, hearing loss, ear pain, congestion, sore throat, rhinorrhea, sneezing, mouth sores, trouble swallowing, neck pain, neck stiffness and tinnitus.   Respiratory: Denies SOB, DOE, cough, chest tightness,  and wheezing.   Cardiovascular: Denies chest pain, palpitations and leg swelling.  Gastrointestinal: Denies nausea, vomiting, abdominal pain, diarrhea, constipation, blood in stool and abdominal distention.  Genitourinary: Denies dysuria, urgency, frequency, hematuria, flank pain and difficulty urinating.  Endocrine: Denies: hot or cold intolerance, sweats, changes in hair or nails, polyuria, polydipsia. Musculoskeletal: Denies myalgias, back pain, joint swelling, arthralgias and gait problem.  Skin: Denies pallor, rash and wound.  Neurological: Denies dizziness, seizures, syncope, weakness, light-headedness, numbness and headaches.  Hematological: Denies adenopathy. Easy bruising, personal or family bleeding history  Psychiatric/Behavioral: Denies suicidal ideation, mood changes, confusion, nervousness, sleep disturbance and agitation    Physical Exam: Vitals:   02/07/22 0958  BP: 130/80  Pulse: 88  Resp: 18  Temp: 97.8 F (36.6 C)  TempSrc: Oral  SpO2: 98%  Weight: 169 lb (76.7 kg)  Height: 5'  6.93" (1.7 m)    Body mass index is 26.53 kg/m.   Constitutional: NAD, calm, comfortable Eyes: PERRL, lids and conjunctivae normal, wears corrective lenses ENMT: Mucous membranes are moist. Posterior pharynx clear of any exudate or lesions. Normal dentition. Tympanic membrane is pearly white, no erythema or bulging. Neck: normal, supple, no masses, no  thyromegaly Respiratory: clear to auscultation bilaterally, no wheezing, no crackles. Normal respiratory effort. No accessory muscle use.  Cardiovascular: Regular rate and rhythm, no murmurs / rubs / gallops. No extremity edema. 2+ pedal pulses. No carotid bruits.  Abdomen: no tenderness, no masses palpated. No hepatosplenomegaly. Bowel sounds positive.  Musculoskeletal: no clubbing / cyanosis. No joint deformity upper and lower extremities. Good ROM, no contractures. Normal muscle tone.  Skin: no rashes, lesions, ulcers. No induration Neurologic: CN 2-12 grossly intact. Sensation intact, DTR normal. Strength 5/5 in all 4.  Psychiatric: Normal judgment and insight. Alert and oriented x 3. Normal mood.   Subsequent Medicare wellness visit   1. Risk factors, based on past  M,S,F -cardiovascular disease risk factors include age, history of hypertension, hyperlipidemia, diabetes   2.  Physical activities: Walks around 20 minutes on a daily basis   3.  Depression/mood: Stable, not depressed   4.  Hearing: No perceived issues   5.  ADL's: Independent in all ADLs   6.  Fall risk: Low fall risk   7.  Home safety: No problems identified   8.  Height weight, and visual acuity: height and weight as above, vision:  Vision Screening   Right eye Left eye Both eyes  Without correction     With correction '20/30 20/30 20/20 '$     9.  Counseling: Advised to update immunization status   10. Lab orders based on risk factors: Laboratory update will be reviewed   11. Referral : None today   12. Care plan: Follow up with me in 3 to 4 months   13. Cognitive assessment: No cognitive impairment   14. Screening: Patient provided with a written and personalized 5-10 year screening schedule in the AVS. yes   15. Provider List Update: PCP only  16. Advance Directives: Full code   17. Opioids: Patient is not on any opioid prescriptions and has no risk factors for a substance use  disorder.   [place] Office Visit from 02/07/2022 in [place] at [place]  PHQ-9 Total Score 0          12/17/2019    8:32 AM 02/04/2021   10:09 AM 05/13/2021   11:30 AM 08/11/2021    9:35 AM 02/07/2022   10:07 AM  Fall Risk  Falls in the past year? 0 0 0 0 0  Was there an injury with Fall? 0 0  0 0  Fall Risk Category Calculator 0 0  0 0  Fall Risk Category Low Low  Low Low  Patient Fall Risk Level   Low fall risk Low fall risk   Patient at Risk for Falls Due to    No Fall Risks   Fall risk Follow up    Falls evaluation completed      Impression and Plan:  Needs flu shot - Plan: Flu Vaccine QUAD High Dose(Fluad)  Encounter for preventive health examination  Type 2 diabetes mellitus with diabetic polyneuropathy, without long-term current use of insulin ([place])  Vitamin D deficiency  Essential hypertension  Hyperlipidemia associated with type 2 diabetes mellitus ([place])   -Recommend routine eye and dental care. -Immunizations: Flu  vaccine in office today, advised to get Tdap and COVID at pharmacy. -Healthy lifestyle discussed in detail. -Labs to be updated today. -Colon cancer screening: 12/2014 -Breast cancer screening: 11/2021 -Cervical cancer screening: 01/2021 -Lung cancer screening: Not applicable -Prostate cancer screening: Not applicable -DEXA: 35/0757      [name], MD [facility] at [facility]

## 2022-02-08 ENCOUNTER — Other Ambulatory Visit (INDEPENDENT_AMBULATORY_CARE_PROVIDER_SITE_OTHER): Payer: Medicare Other

## 2022-02-08 DIAGNOSIS — E559 Vitamin D deficiency, unspecified: Secondary | ICD-10-CM | POA: Diagnosis not present

## 2022-02-08 DIAGNOSIS — E1142 Type 2 diabetes mellitus with diabetic polyneuropathy: Secondary | ICD-10-CM

## 2022-02-08 LAB — CBC WITH DIFFERENTIAL/PLATELET
Basophils Absolute: 0 10*3/uL (ref 0.0–0.1)
Basophils Relative: 0.7 % (ref 0.0–3.0)
Eosinophils Absolute: 0.3 10*3/uL (ref 0.0–0.7)
Eosinophils Relative: 4.1 % (ref 0.0–5.0)
HCT: 38.2 % (ref 36.0–46.0)
Hemoglobin: 13 g/dL (ref 12.0–15.0)
Lymphocytes Relative: 35.8 % (ref 12.0–46.0)
Lymphs Abs: 2.4 10*3/uL (ref 0.7–4.0)
MCHC: 34.1 g/dL (ref 30.0–36.0)
MCV: 89.4 fl (ref 78.0–100.0)
Monocytes Absolute: 0.5 10*3/uL (ref 0.1–1.0)
Monocytes Relative: 7.3 % (ref 3.0–12.0)
Neutro Abs: 3.5 10*3/uL (ref 1.4–7.7)
Neutrophils Relative %: 52.1 % (ref 43.0–77.0)
Platelets: 318 10*3/uL (ref 150.0–400.0)
RBC: 4.28 Mil/uL (ref 3.87–5.11)
RDW: 15 % (ref 11.5–15.5)
WBC: 6.7 10*3/uL (ref 4.0–10.5)

## 2022-02-08 LAB — COMPREHENSIVE METABOLIC PANEL
ALT: 24 U/L (ref 0–35)
AST: 14 U/L (ref 0–37)
Albumin: 4.6 g/dL (ref 3.5–5.2)
Alkaline Phosphatase: 80 U/L (ref 39–117)
BUN: 10 mg/dL (ref 6–23)
CO2: 32 mEq/L (ref 19–32)
Calcium: 9.7 mg/dL (ref 8.4–10.5)
Chloride: 100 mEq/L (ref 96–112)
Creatinine, Ser: 0.72 mg/dL (ref 0.40–1.20)
GFR: 84.53 mL/min (ref >60.00)
Glucose, Bld: 107 mg/dL — ABNORMAL HIGH (ref 70–99)
Potassium: 4 mEq/L (ref 3.5–5.1)
Sodium: 140 mEq/L (ref 135–145)
Total Bilirubin: 0.8 mg/dL (ref 0.2–1.2)
Total Protein: 7.3 g/dL (ref 6.0–8.3)

## 2022-02-08 LAB — LIPID PANEL
Cholesterol: 139 mg/dL (ref 0–200)
HDL: 37.8 mg/dL — ABNORMAL LOW (ref >39.00)
LDL Cholesterol: 68 mg/dL (ref 0–99)
NonHDL: 101.4
Total CHOL/HDL Ratio: 4
Triglycerides: 168 mg/dL — ABNORMAL HIGH (ref 0.0–149.0)
VLDL: 33.6 mg/dL (ref 0.0–40.0)

## 2022-02-08 LAB — VITAMIN D 25 HYDROXY (VIT D DEFICIENCY, FRACTURES): VITD: 57.22 ng/mL (ref 30.00–100.00)

## 2022-02-08 LAB — HEMOGLOBIN A1C: Hgb A1c MFr Bld: 6.5 % (ref 4.6–6.5)

## 2022-02-16 ENCOUNTER — Telehealth: Payer: Self-pay | Admitting: Internal Medicine

## 2022-02-16 NOTE — Telephone Encounter (Signed)
Pt called to request a call back to go over the results of her lab work from 02/08/22.

## 2022-02-17 NOTE — Telephone Encounter (Signed)
Patient informed of the results and expressed understanding

## 2022-03-08 DIAGNOSIS — H1045 Other chronic allergic conjunctivitis: Secondary | ICD-10-CM | POA: Diagnosis not present

## 2022-03-08 DIAGNOSIS — H26491 Other secondary cataract, right eye: Secondary | ICD-10-CM | POA: Diagnosis not present

## 2022-03-08 DIAGNOSIS — Z961 Presence of intraocular lens: Secondary | ICD-10-CM | POA: Diagnosis not present

## 2022-03-08 LAB — HM DIABETES EYE EXAM

## 2022-03-14 ENCOUNTER — Encounter: Payer: Self-pay | Admitting: Internal Medicine

## 2022-03-14 ENCOUNTER — Encounter: Payer: Medicare Other | Admitting: Internal Medicine

## 2022-03-14 ENCOUNTER — Ambulatory Visit: Payer: Medicare Other | Admitting: Internal Medicine

## 2022-03-15 ENCOUNTER — Encounter: Payer: Self-pay | Admitting: Internal Medicine

## 2022-04-01 ENCOUNTER — Other Ambulatory Visit: Payer: Self-pay | Admitting: Internal Medicine

## 2022-04-01 DIAGNOSIS — I10 Essential (primary) hypertension: Secondary | ICD-10-CM

## 2022-05-31 NOTE — Progress Notes (Signed)
This encounter was created in error - please disregard.

## 2022-06-07 DIAGNOSIS — H903 Sensorineural hearing loss, bilateral: Secondary | ICD-10-CM | POA: Diagnosis not present

## 2022-06-08 DIAGNOSIS — H903 Sensorineural hearing loss, bilateral: Secondary | ICD-10-CM | POA: Diagnosis not present

## 2022-06-14 ENCOUNTER — Ambulatory Visit (INDEPENDENT_AMBULATORY_CARE_PROVIDER_SITE_OTHER): Payer: Medicare Other | Admitting: Internal Medicine

## 2022-06-14 ENCOUNTER — Encounter: Payer: Self-pay | Admitting: Internal Medicine

## 2022-06-14 VITALS — BP 132/78 | HR 64 | Temp 97.9°F | Wt 166.6 lb

## 2022-06-14 DIAGNOSIS — E1169 Type 2 diabetes mellitus with other specified complication: Secondary | ICD-10-CM | POA: Diagnosis not present

## 2022-06-14 DIAGNOSIS — E785 Hyperlipidemia, unspecified: Secondary | ICD-10-CM

## 2022-06-14 DIAGNOSIS — E559 Vitamin D deficiency, unspecified: Secondary | ICD-10-CM | POA: Diagnosis not present

## 2022-06-14 DIAGNOSIS — E1142 Type 2 diabetes mellitus with diabetic polyneuropathy: Secondary | ICD-10-CM | POA: Diagnosis not present

## 2022-06-14 DIAGNOSIS — I10 Essential (primary) hypertension: Secondary | ICD-10-CM

## 2022-06-14 LAB — POCT GLYCOSYLATED HEMOGLOBIN (HGB A1C): Hemoglobin A1C: 6.1 % — AB (ref 4.0–5.6)

## 2022-06-14 NOTE — Progress Notes (Signed)
Established Patient Office Visit     CC/Reason for Visit: 78-monthfollow-up chronic medical conditions  HPI: Danielle Pearmanis a 72y.o. female who is coming in today for the above mentioned reasons. Past Medical History is significant for: Hypertension, hyperlipidemia, type 2 diabetes with peripheral neuropathy, vitamin D deficiency.  She is feeling well today and has no acute concerns or complaints.   Past Medical/Surgical History: Past Medical History:  Diagnosis Date   Anxiety    Depression    DV[place] (deep [place]) ([place]    History over 30 yrs ago, no further problems   HYPERLIPIDEMIA 06/05/2008   MENOPAUSAL SYNDROME 06/05/2008    Past Surgical History:  Procedure Laterality Date   ABDOMINAL HYSTERECTOMY     CESAREAN SECTION     x 2   COLONOSCOPY  2005   CYSTECTOMY     left elbow   LAPAROSCOPIC OVARIAN CYSTECTOMY      Social History:  reports that she has never smoked. She has never used smokeless tobacco. She reports that she does not drink alcohol and does not use drugs.  Allergies: No Known Allergies  Family History:  Family History  Problem Relation Age of Onset   Heart disease Father    Cancer Sister    Colon cancer Neg Hx    Breast cancer Neg Hx      Current Outpatient Medications:    atorvastatin (LIPITOR) 40 MG tablet, TAKE 1 TABLET BY MOUTH EVERY DAY, Disp: 90 tablet, Rfl: 1   cholecalciferol (VITAMIN D) 1000 UNITS tablet, Take 1,000 Units by mouth daily., Disp: , Rfl:    cromolyn (OPTICROM) 4 % ophthalmic solution, 1 drop 4 (four) times daily., Disp: , Rfl:    fluticasone (FLONASE) 50 MCG/ACT nasal spray, Instill 2 puffs each nostril every night., Disp: , Rfl:    hydrochlorothiazide (HYDRODIURIL) 25 MG tablet, TAKE 1 TABLET BY MOUTH EVERY DAY, Disp: 90 tablet, Rfl: 1   metFORMIN (GLUCOPHAGE) 500 MG tablet, TAKE 1 TABLET BY MOUTH EVERYDAY AT BEDTIME, Disp: 90 tablet, Rfl: 1   Multiple Vitamin (MULTIVITAMIN) tablet, Take 1 tablet  by mouth daily., Disp: , Rfl:   Review of Systems:  Negative unless indicated in HPI.   Physical Exam: Vitals:   06/14/22 1053 06/14/22 1109  BP: 134/82 132/78  Pulse: 64   Temp: 97.9 F (36.6 C)   TempSrc: Oral   SpO2: 98%   Weight: 166 lb 9.6 oz (75.6 kg)     Body mass index is 26.15 kg/m.   Physical Exam Vitals reviewed.  Constitutional:      Appearance: Normal appearance.  HENT:     Head: Normocephalic and atraumatic.  Eyes:     Conjunctiva/sclera: Conjunctivae normal.     Pupils: Pupils are equal, round, and reactive to light.  Cardiovascular:     Rate and Rhythm: Normal rate and regular rhythm.  Pulmonary:     Effort: Pulmonary effort is normal.     Breath sounds: Normal breath sounds.  Skin:    General: Skin is warm and dry.  Neurological:     General: No focal deficit present.     Mental Status: She is alert and oriented to person, place, and time.  Psychiatric:        Mood and Affect: Mood normal.        Behavior: Behavior normal.        Thought Content: Thought content normal.        Judgment: Judgment  normal.      Impression and Plan:  Type 2 diabetes mellitus with diabetic polyneuropathy, without long-term current use of insulin ([place]) - Plan: POC HgB A1c, Urine microalbumin-creatinine with uACR  Essential hypertension  Hyperlipidemia associated with type 2 diabetes mellitus (HCC)  Vitamin D deficiency  -A1c of 6.1 demonstrates excellent diabetic control. -Blood pressure is fairly well-controlled. -LDL was optimal at 68 as of October 2023.  Time spent:31 minutes reviewing chart, interviewing and examining patient and formulating plan of care.     [name], MD [facility] at [facility]

## 2022-07-04 ENCOUNTER — Telehealth: Payer: Self-pay | Admitting: Internal Medicine

## 2022-07-04 NOTE — Telephone Encounter (Signed)
Pt is requesting a call back to let her know if Dr. Jerilee Hoh recommends she get the RSV vaccine?

## 2022-07-05 NOTE — Telephone Encounter (Signed)
Patient informed of the message below.

## 2022-07-29 ENCOUNTER — Other Ambulatory Visit: Payer: Self-pay | Admitting: Internal Medicine

## 2022-07-29 DIAGNOSIS — E1169 Type 2 diabetes mellitus with other specified complication: Secondary | ICD-10-CM

## 2022-07-29 DIAGNOSIS — E785 Hyperlipidemia, unspecified: Secondary | ICD-10-CM

## 2022-09-26 ENCOUNTER — Other Ambulatory Visit: Payer: Self-pay | Admitting: Internal Medicine

## 2022-09-26 DIAGNOSIS — I10 Essential (primary) hypertension: Secondary | ICD-10-CM

## 2022-10-11 ENCOUNTER — Other Ambulatory Visit: Payer: Self-pay | Admitting: Internal Medicine

## 2022-10-11 DIAGNOSIS — Z1231 Encounter for screening mammogram for malignant neoplasm of breast: Secondary | ICD-10-CM

## 2022-10-13 ENCOUNTER — Encounter: Payer: Self-pay | Admitting: Internal Medicine

## 2022-10-13 ENCOUNTER — Ambulatory Visit (INDEPENDENT_AMBULATORY_CARE_PROVIDER_SITE_OTHER): Payer: Medicare Other | Admitting: Internal Medicine

## 2022-10-13 VITALS — BP 130/79 | HR 100 | Temp 98.2°F | Wt 165.6 lb

## 2022-10-13 DIAGNOSIS — E1142 Type 2 diabetes mellitus with diabetic polyneuropathy: Secondary | ICD-10-CM

## 2022-10-13 DIAGNOSIS — E1169 Type 2 diabetes mellitus with other specified complication: Secondary | ICD-10-CM | POA: Diagnosis not present

## 2022-10-13 DIAGNOSIS — I10 Essential (primary) hypertension: Secondary | ICD-10-CM | POA: Diagnosis not present

## 2022-10-13 DIAGNOSIS — Z7984 Long term (current) use of oral hypoglycemic drugs: Secondary | ICD-10-CM | POA: Diagnosis not present

## 2022-10-13 DIAGNOSIS — M549 Dorsalgia, unspecified: Secondary | ICD-10-CM

## 2022-10-13 DIAGNOSIS — E785 Hyperlipidemia, unspecified: Secondary | ICD-10-CM

## 2022-10-13 LAB — POCT GLYCOSYLATED HEMOGLOBIN (HGB A1C): Hemoglobin A1C: 6 % — AB (ref 4.0–5.6)

## 2022-10-13 NOTE — Assessment & Plan Note (Signed)
Well controlled with an A1c of 6.0.

## 2022-10-13 NOTE — Assessment & Plan Note (Signed)
LDL at goal of <70 at last check 10/23. On atorvastatin 40 mg daily.

## 2022-10-13 NOTE — Assessment & Plan Note (Signed)
Fair control on HCTZ daily.

## 2022-10-13 NOTE — Progress Notes (Signed)
Established Patient Office Visit     CC/Reason for Visit: 83-month follow-up chronic medical conditions, right upper back pain  HPI: Danielle Morse is a 72 y.o. female who is coming in today for the above mentioned reasons. Past Medical History is significant for: Hypertension, hyperlipidemia, type 2 diabetes with peripheral neuropathy, vitamin D deficiency.  She has been feeling well other than the right upper back pain.  She was helping her sister get out of the car and doing that felt an immediate pain in her right upper back.  This was 2 weeks ago and it has not improved.   Past Medical/Surgical History: Past Medical History:  Diagnosis Date   Anxiety    Depression    DVT (deep venous thrombosis) (HCC)    History over 30 yrs ago, no further problems   HYPERLIPIDEMIA 06/05/2008   MENOPAUSAL SYNDROME 06/05/2008    Past Surgical History:  Procedure Laterality Date   ABDOMINAL HYSTERECTOMY     CESAREAN SECTION     x 2   COLONOSCOPY  2005   CYSTECTOMY     left elbow   LAPAROSCOPIC OVARIAN CYSTECTOMY      Social History:  reports that she has never smoked. She has never used smokeless tobacco. She reports that she does not drink alcohol and does not use drugs.  Allergies: No Known Allergies  Family History:  Family History  Problem Relation Age of Onset   Heart disease Father    Cancer Sister    Colon cancer Neg Hx    Breast cancer Neg Hx      Current Outpatient Medications:    atorvastatin (LIPITOR) 40 MG tablet, TAKE 1 TABLET BY MOUTH EVERY DAY, Disp: 90 tablet, Rfl: 1   cholecalciferol (VITAMIN D) 1000 UNITS tablet, Take 1,000 Units by mouth daily., Disp: , Rfl:    cromolyn (OPTICROM) 4 % ophthalmic solution, 1 drop 4 (four) times daily., Disp: , Rfl:    fluticasone (FLONASE) 50 MCG/ACT nasal spray, Instill 2 puffs each nostril every night., Disp: , Rfl:    hydrochlorothiazide (HYDRODIURIL) 25 MG tablet, TAKE 1 TABLET BY MOUTH EVERY DAY, Disp: 90  tablet, Rfl: 1   metFORMIN (GLUCOPHAGE) 500 MG tablet, TAKE 1 TABLET BY MOUTH EVERYDAY AT BEDTIME, Disp: 90 tablet, Rfl: 1   Multiple Vitamin (MULTIVITAMIN) tablet, Take 1 tablet by mouth daily., Disp: , Rfl:   Review of Systems:  Negative unless indicated in HPI.   Physical Exam: Vitals:   10/13/22 1049 10/13/22 1050 10/13/22 1110  BP: (!) 148/84 (!) 145/86 130/79  Pulse: 100    Temp: 98.2 F (36.8 C)    TempSrc: Oral    SpO2: 100%    Weight: 165 lb 9.6 oz (75.1 kg)      Body mass index is 25.99 kg/m.   Physical Exam Vitals reviewed.  Constitutional:      Appearance: Normal appearance.  HENT:     Head: Normocephalic and atraumatic.  Eyes:     Conjunctiva/sclera: Conjunctivae normal.     Pupils: Pupils are equal, round, and reactive to light.  Cardiovascular:     Rate and Rhythm: Normal rate and regular rhythm.  Pulmonary:     Effort: Pulmonary effort is normal.     Breath sounds: Normal breath sounds.  Skin:    General: Skin is warm and dry.  Neurological:     General: No focal deficit present.     Mental Status: She is alert and oriented to person,  place, and time.  Psychiatric:        Mood and Affect: Mood normal.        Behavior: Behavior normal.        Thought Content: Thought content normal.        Judgment: Judgment normal.      Impression and Plan:  Type 2 diabetes mellitus with diabetic polyneuropathy, without long-term current use of insulin (HCC) Assessment & Plan: Well controlled with an A1c of 6.0.  Orders: -     POCT glycosylated hemoglobin (Hb A1C) -     Microalbumin / creatinine urine ratio  Essential hypertension Assessment & Plan: Fair control on HCTZ daily.   Hyperlipidemia associated with type 2 diabetes mellitus (HCC) Assessment & Plan: LDL at goal of <70 at last check 10/23. On atorvastatin 40 mg daily.   Upper back pain on right side  -Suspect this to be musculoskeletal.  Have advised icing, as needed ibuprofen.  If no  improvement she knows to follow-up with me.   Time spent:33 minutes reviewing chart, interviewing and examining patient and formulating plan of care.     Chaya Jan, MD [facility] at [facility]

## 2022-11-01 DIAGNOSIS — M5136 Other intervertebral disc degeneration, lumbar region: Secondary | ICD-10-CM | POA: Diagnosis not present

## 2022-11-01 DIAGNOSIS — M9904 Segmental and somatic dysfunction of sacral region: Secondary | ICD-10-CM | POA: Diagnosis not present

## 2022-11-01 DIAGNOSIS — M9903 Segmental and somatic dysfunction of lumbar region: Secondary | ICD-10-CM | POA: Diagnosis not present

## 2022-11-01 DIAGNOSIS — M9905 Segmental and somatic dysfunction of pelvic region: Secondary | ICD-10-CM | POA: Diagnosis not present

## 2022-11-08 DIAGNOSIS — M5136 Other intervertebral disc degeneration, lumbar region: Secondary | ICD-10-CM | POA: Diagnosis not present

## 2022-11-08 DIAGNOSIS — M9903 Segmental and somatic dysfunction of lumbar region: Secondary | ICD-10-CM | POA: Diagnosis not present

## 2022-11-08 DIAGNOSIS — M9905 Segmental and somatic dysfunction of pelvic region: Secondary | ICD-10-CM | POA: Diagnosis not present

## 2022-11-08 DIAGNOSIS — M9904 Segmental and somatic dysfunction of sacral region: Secondary | ICD-10-CM | POA: Diagnosis not present

## 2022-11-15 ENCOUNTER — Ambulatory Visit
Admission: RE | Admit: 2022-11-15 | Discharge: 2022-11-15 | Disposition: A | Discharge status: Home / Self Care | Payer: Medicare Other | Source: Ambulatory Visit | Attending: Internal Medicine | Admitting: Internal Medicine

## 2022-11-15 ENCOUNTER — Ambulatory Visit: Payer: Medicare Other

## 2022-11-15 DIAGNOSIS — Z1231 Encounter for screening mammogram for malignant neoplasm of breast: Secondary | ICD-10-CM | POA: Diagnosis not present

## 2023-01-04 ENCOUNTER — Other Ambulatory Visit: Payer: Self-pay | Admitting: Internal Medicine

## 2023-01-04 DIAGNOSIS — I10 Essential (primary) hypertension: Secondary | ICD-10-CM

## 2023-01-05 DIAGNOSIS — M5136 Other intervertebral disc degeneration, lumbar region: Secondary | ICD-10-CM | POA: Diagnosis not present

## 2023-01-05 DIAGNOSIS — M9904 Segmental and somatic dysfunction of sacral region: Secondary | ICD-10-CM | POA: Diagnosis not present

## 2023-01-05 DIAGNOSIS — M9903 Segmental and somatic dysfunction of lumbar region: Secondary | ICD-10-CM | POA: Diagnosis not present

## 2023-01-05 DIAGNOSIS — M9905 Segmental and somatic dysfunction of pelvic region: Secondary | ICD-10-CM | POA: Diagnosis not present

## 2023-01-11 DIAGNOSIS — M5136 Other intervertebral disc degeneration, lumbar region: Secondary | ICD-10-CM | POA: Diagnosis not present

## 2023-01-11 DIAGNOSIS — M9904 Segmental and somatic dysfunction of sacral region: Secondary | ICD-10-CM | POA: Diagnosis not present

## 2023-01-11 DIAGNOSIS — M9903 Segmental and somatic dysfunction of lumbar region: Secondary | ICD-10-CM | POA: Diagnosis not present

## 2023-01-11 DIAGNOSIS — M9905 Segmental and somatic dysfunction of pelvic region: Secondary | ICD-10-CM | POA: Diagnosis not present

## 2023-01-18 DIAGNOSIS — M9904 Segmental and somatic dysfunction of sacral region: Secondary | ICD-10-CM | POA: Diagnosis not present

## 2023-01-18 DIAGNOSIS — M9905 Segmental and somatic dysfunction of pelvic region: Secondary | ICD-10-CM | POA: Diagnosis not present

## 2023-01-18 DIAGNOSIS — M9903 Segmental and somatic dysfunction of lumbar region: Secondary | ICD-10-CM | POA: Diagnosis not present

## 2023-01-18 DIAGNOSIS — M5136 Other intervertebral disc degeneration, lumbar region: Secondary | ICD-10-CM | POA: Diagnosis not present

## 2023-01-27 ENCOUNTER — Other Ambulatory Visit: Payer: Self-pay | Admitting: Internal Medicine

## 2023-01-27 DIAGNOSIS — E1169 Type 2 diabetes mellitus with other specified complication: Secondary | ICD-10-CM

## 2023-01-27 DIAGNOSIS — E785 Hyperlipidemia, unspecified: Secondary | ICD-10-CM

## 2023-02-08 NOTE — Progress Notes (Signed)
Medicare Wellness question were answered 02/08/23.

## 2023-02-09 DIAGNOSIS — M9904 Segmental and somatic dysfunction of sacral region: Secondary | ICD-10-CM | POA: Diagnosis not present

## 2023-02-09 DIAGNOSIS — M9903 Segmental and somatic dysfunction of lumbar region: Secondary | ICD-10-CM | POA: Diagnosis not present

## 2023-02-09 DIAGNOSIS — M9905 Segmental and somatic dysfunction of pelvic region: Secondary | ICD-10-CM | POA: Diagnosis not present

## 2023-02-13 ENCOUNTER — Ambulatory Visit: Payer: Medicare Other | Admitting: Internal Medicine

## 2023-02-13 ENCOUNTER — Encounter: Payer: Self-pay | Admitting: Internal Medicine

## 2023-02-13 VITALS — BP 120/60 | HR 97 | Temp 97.8°F | Ht 66.0 in | Wt 164.0 lb

## 2023-02-13 DIAGNOSIS — I10 Essential (primary) hypertension: Secondary | ICD-10-CM | POA: Diagnosis not present

## 2023-02-13 DIAGNOSIS — Z780 Asymptomatic menopausal state: Secondary | ICD-10-CM

## 2023-02-13 DIAGNOSIS — Z0000 Encounter for general adult medical examination without abnormal findings: Secondary | ICD-10-CM

## 2023-02-13 DIAGNOSIS — E1169 Type 2 diabetes mellitus with other specified complication: Secondary | ICD-10-CM

## 2023-02-13 DIAGNOSIS — E559 Vitamin D deficiency, unspecified: Secondary | ICD-10-CM

## 2023-02-13 DIAGNOSIS — E1142 Type 2 diabetes mellitus with diabetic polyneuropathy: Secondary | ICD-10-CM

## 2023-02-13 DIAGNOSIS — E785 Hyperlipidemia, unspecified: Secondary | ICD-10-CM | POA: Diagnosis not present

## 2023-02-13 DIAGNOSIS — Z23 Encounter for immunization: Secondary | ICD-10-CM

## 2023-02-13 LAB — CBC WITH DIFFERENTIAL/PLATELET
Basophils Absolute: 0 10*3/uL (ref 0.0–0.1)
Basophils Relative: 0.8 % (ref 0.0–3.0)
Eosinophils Absolute: 0.2 10*3/uL (ref 0.0–0.7)
Eosinophils Relative: 3 % (ref 0.0–5.0)
HCT: 40.5 % (ref 36.0–46.0)
Hemoglobin: 13.1 g/dL (ref 12.0–15.0)
Lymphocytes Relative: 36.3 % (ref 12.0–46.0)
Lymphs Abs: 2.1 10*3/uL (ref 0.7–4.0)
MCHC: 32.3 g/dL (ref 30.0–36.0)
MCV: 91 fL (ref 78.0–100.0)
Monocytes Absolute: 0.4 10*3/uL (ref 0.1–1.0)
Monocytes Relative: 7.2 % (ref 3.0–12.0)
Neutro Abs: 3 10*3/uL (ref 1.4–7.7)
Neutrophils Relative %: 52.7 % (ref 43.0–77.0)
Platelets: 378 10*3/uL (ref 150.0–400.0)
RBC: 4.46 Mil/uL (ref 3.87–5.11)
RDW: 14.5 % (ref 11.5–15.5)
WBC: 5.7 10*3/uL (ref 4.0–10.5)

## 2023-02-13 LAB — COMPREHENSIVE METABOLIC PANEL
ALT: 19 U/L (ref 0–35)
AST: 14 U/L (ref 0–37)
Albumin: 4.7 g/dL (ref 3.5–5.2)
Alkaline Phosphatase: 73 U/L (ref 39–117)
BUN: 11 mg/dL (ref 6–23)
CO2: 30 meq/L (ref 19–32)
Calcium: 10 mg/dL (ref 8.4–10.5)
Chloride: 99 meq/L (ref 96–112)
Creatinine, Ser: 0.67 mg/dL (ref 0.40–1.20)
GFR: 87.74 mL/min (ref >60.00)
Glucose, Bld: 102 mg/dL — ABNORMAL HIGH (ref 70–99)
Potassium: 3.7 meq/L (ref 3.5–5.1)
Sodium: 140 meq/L (ref 135–145)
Total Bilirubin: 0.6 mg/dL (ref 0.2–1.2)
Total Protein: 7.5 g/dL (ref 6.0–8.3)

## 2023-02-13 LAB — LIPID PANEL
Cholesterol: 131 mg/dL (ref 0–200)
HDL: 44.2 mg/dL (ref >39.00)
LDL Cholesterol: 64 mg/dL (ref 0–99)
NonHDL: 86.34
Total CHOL/HDL Ratio: 3
Triglycerides: 114 mg/dL (ref 0.0–149.0)
VLDL: 22.8 mg/dL (ref 0.0–40.0)

## 2023-02-13 LAB — VITAMIN D 25 HYDROXY (VIT D DEFICIENCY, FRACTURES): VITD: 67.98 ng/mL (ref 30.00–100.00)

## 2023-02-13 LAB — HEMOGLOBIN A1C: Hgb A1c MFr Bld: 6.4 % (ref 4.6–6.5)

## 2023-02-13 NOTE — Progress Notes (Signed)
Established Patient Office Visit     CC/Reason for Visit: Annual preventive exam and subsequent Medicare wellness visit  HPI: Danielle Morse is a 72 y.o. female who is coming in today for the above mentioned reasons. Past Medical History is significant for: Hypertension, hyperlipidemia, type 2 diabetes with peripheral neuropathy, vitamin D deficiency.  Feeling well without acute concerns or complaints.  Has routine eye and dental care.  Is due for flu vaccination.  Is due for DEXA scan.   Past Medical/Surgical History: Past Medical History:  Diagnosis Date   Anxiety    Depression    DVT (deep venous thrombosis) (HCC)    History over 30 yrs ago, no further problems   HYPERLIPIDEMIA 06/05/2008   MENOPAUSAL SYNDROME 06/05/2008    Past Surgical History:  Procedure Laterality Date   ABDOMINAL HYSTERECTOMY     CESAREAN SECTION     x 2   COLONOSCOPY  2005   CYSTECTOMY     left elbow   LAPAROSCOPIC OVARIAN CYSTECTOMY      Social History:  reports that she has never smoked. She has never used smokeless tobacco. She reports that she does not drink alcohol and does not use drugs.  Allergies: No Known Allergies  Family History:  Family History  Problem Relation Age of Onset   Heart disease Father    Cancer Sister    Colon cancer Neg Hx    Breast cancer Neg Hx      Current Outpatient Medications:    atorvastatin (LIPITOR) 40 MG tablet, TAKE 1 TABLET BY MOUTH EVERY DAY, Disp: 90 tablet, Rfl: 1   cholecalciferol (VITAMIN D) 1000 UNITS tablet, Take 1,000 Units by mouth daily., Disp: , Rfl:    cromolyn (OPTICROM) 4 % ophthalmic solution, 1 drop 4 (four) times daily., Disp: , Rfl:    cyanocobalamin (VITAMIN B12) 1000 MCG tablet, Take 1,000 mcg by mouth daily., Disp: , Rfl:    hydrochlorothiazide (HYDRODIURIL) 25 MG tablet, TAKE 1 TABLET BY MOUTH EVERY DAY, Disp: 90 tablet, Rfl: 0   metFORMIN (GLUCOPHAGE) 500 MG tablet, TAKE 1 TABLET BY MOUTH EVERYDAY AT BEDTIME,  Disp: 90 tablet, Rfl: 1   Multiple Vitamin (MULTIVITAMIN) tablet, Take 1 tablet by mouth daily., Disp: , Rfl:   Review of Systems:  Negative unless indicated in HPI.   Physical Exam: Vitals:   02/13/23 1012  BP: 120/60  Pulse: 97  Temp: 97.8 F (36.6 C)  TempSrc: Oral  SpO2: 99%  Weight: 164 lb (74.4 kg)  Height: 5\' 6"  (1.676 m)    Body mass index is 26.47 kg/m.   Physical Exam Vitals reviewed.  Constitutional:      General: She is not in acute distress.    Appearance: Normal appearance. She is not ill-appearing, toxic-appearing or diaphoretic.  HENT:     Head: Normocephalic.     Right Ear: Tympanic membrane, ear canal and external ear normal. There is no impacted cerumen.     Left Ear: Tympanic membrane, ear canal and external ear normal. There is no impacted cerumen.     Nose: Nose normal.     Mouth/Throat:     Mouth: Mucous membranes are moist.     Pharynx: Oropharynx is clear. No oropharyngeal exudate or posterior oropharyngeal erythema.  Eyes:     General: No scleral icterus.       Right eye: No discharge.        Left eye: No discharge.     Conjunctiva/sclera: Conjunctivae normal.  Pupils: Pupils are equal, round, and reactive to light.  Neck:     Vascular: No carotid bruit.  Cardiovascular:     Rate and Rhythm: Normal rate and regular rhythm.     Pulses: Normal pulses.     Heart sounds: Normal heart sounds.  Pulmonary:     Effort: Pulmonary effort is normal. No respiratory distress.     Breath sounds: Normal breath sounds.  Abdominal:     General: Abdomen is flat. Bowel sounds are normal.     Palpations: Abdomen is soft.  Musculoskeletal:        General: Normal range of motion.     Cervical back: Normal range of motion.  Skin:    General: Skin is warm and dry.  Neurological:     General: No focal deficit present.     Mental Status: She is alert and oriented to person, place, and time. Mental status is at baseline.  Psychiatric:        Mood and  Affect: Mood normal.        Behavior: Behavior normal.        Thought Content: Thought content normal.        Judgment: Judgment normal.     Diabetic Foot Exam - Simple   Simple Foot Form Diabetic Foot exam was performed with the following findings: Yes 02/13/2023 10:37 AM  Visual Inspection No deformities, no ulcerations, no other skin breakdown bilaterally: Yes Sensation Testing See comments: Yes Pulse Check Posterior Tibialis and Dorsalis pulse intact bilaterally: Yes Comments Decreased vibration and proprioception left greater than right.  Known diabetic peripheral neuropathy.    Subsequent Medicare wellness visit   1. Risk factors, based on past  M,S,F - Cardiac Risk Factors include: advanced age (>45men, >69 women)   2.  Physical activities: Dietary issues and exercise activities discussed:      3.  Depression/mood:  Flowsheet Row Office Visit from 02/13/2023 in [facility] Total Score 0        4.  ADL's:    02/08/2023    1:27 PM  In your present state of health, do you have any difficulty performing the following activities:  Hearing? 0  Vision? 0  Difficulty concentrating or making decisions? 0  Walking or climbing stairs? 0  Dressing or bathing? 0  Doing errands, shopping? 0  Preparing Food and eating ? N  Using the Toilet? N  In the past six months, have you accidently leaked urine? N  Do you have problems with loss of bowel control? N  Managing your Medications? N  Managing your Finances? N  Housekeeping or managing your Housekeeping? N     5.  Fall risk:     08/11/2021    9:35 AM 02/07/2022   10:07 AM 06/14/2022   10:59 AM 10/13/2022   11:03 AM 02/08/2023    1:29 PM  Fall Risk  Falls in the past year? 0 0 0 0 0  Was there an injury with Fall? 0 0 0 0 0  Fall Risk Category Calculator 0 0 0 0 0  Fall Risk Category (Retired) Low Low     (RETIRED) Patient Fall Risk Level Low fall risk      Patient at Risk for  Falls Due to No Fall Risks  No Fall Risks    Fall risk Follow up Falls evaluation completed  Falls evaluation completed Falls evaluation completed Falls evaluation completed     6.  Home  safety: No problems identified   7.  Height weight, and visual acuity: height and weight as above, vision/hearing: Vision Screening   Right eye Left eye Both eyes  Without correction     With correction 20/20 20/40 20/20      8.  Counseling: Counseling given: Not Answered    9. Lab orders based on risk factors: Laboratory update will be reviewed   10. Cognitive assessment:        02/08/2023    1:29 PM  6CIT Screen  What Year? 0 points  What month? 0 points  What time? 0 points  Count back from 20 0 points  Months in reverse 0 points  Repeat phrase 0 points  Total Score 0 points     11. Screening: Patient provided with a written and personalized 5-10 year screening schedule in the AVS. Health Maintenance  Topic Date Due   Yearly kidney health urinalysis for diabetes  05/13/2022   COVID-19 Vaccine (7 - 2023-24 season) 01/08/2023   Yearly kidney function blood test for diabetes  02/09/2023   Eye exam for diabetics  03/09/2023   Hemoglobin A1C  04/14/2023   Mammogram  11/15/2023   Complete foot exam   02/13/2024   Medicare Annual Wellness Visit  02/13/2024   Colon Cancer Screening  12/22/2024   DTaP/Tdap/Td vaccine (3 - Td or Tdap) 02/09/2032   Pneumonia Vaccine  Completed   Flu Shot  Completed   DEXA scan (bone density measurement)  Completed   Hepatitis C Screening  Completed   Zoster (Shingles) Vaccine  Completed   HPV Vaccine  Aged Out    12. Provider List Update: Patient Care Team    Relationship Specialty Notifications Start End  Philip Aspen, Limmie Patricia, MD PCP - General Internal Medicine  04/19/18      13. Advance Directives: Does Patient Have a Medical Advance Directive?: No Would patient like information on creating a medical advance directive?: No - Patient  declined  14. Opioids: Patient is not on any opioid prescriptions and has no risk factors for a substance use disorder.   15.   Goals      CCM Expected Outcome:  Monitor, Self-Manage and Reduce Symptoms of Diabetes         I have personally reviewed and noted the following in the patient's chart:   Medical and social history Use of alcohol, tobacco or illicit drugs  Current medications and supplements Functional ability and status Nutritional status Physical activity Advanced directives List of other physicians Hospitalizations, surgeries, and ER visits in previous 12 months Vitals Screenings to include cognitive, depression, and falls Referrals and appointments  In addition, I have reviewed and discussed with patient certain preventive protocols, quality metrics, and best practice recommendations. A written personalized care plan for preventive services as well as general preventive health recommendations were provided to patient.   Impression and Plan:  Medicare annual wellness visit, subsequent  Type 2 diabetes mellitus with diabetic polyneuropathy, without long-term current use of insulin (HCC) -     CBC with Differential/Platelet; Future -     Comprehensive metabolic panel; Future -     Hemoglobin A1c; Future -     Microalbumin / creatinine urine ratio; Future  Essential hypertension  Hyperlipidemia associated with type 2 diabetes mellitus (HCC) -     Lipid panel; Future  Vitamin D deficiency -     VITAMIN D 25 Hydroxy (Vit-D Deficiency, Fractures); Future  Need for influenza vaccination -  Flu Vaccine Trivalent High Dose (Fluad)  Postmenopausal estrogen deficiency -     DG Bone Density; Future   -Recommend routine eye and dental care. -Healthy lifestyle discussed in detail. -Labs to be updated today. -Prostate cancer screening: N/A Health Maintenance  Topic Date Due   Yearly kidney health urinalysis for diabetes  05/13/2022   COVID-19 Vaccine (7  - 2023-24 season) 01/08/2023   Yearly kidney function blood test for diabetes  02/09/2023   Eye exam for diabetics  03/09/2023   Hemoglobin A1C  04/14/2023   Mammogram  11/15/2023   Complete foot exam   02/13/2024   Medicare Annual Wellness Visit  02/13/2024   Colon Cancer Screening  12/22/2024   DTaP/Tdap/Td vaccine (3 - Td or Tdap) 02/09/2032   Pneumonia Vaccine  Completed   Flu Shot  Completed   DEXA scan (bone density measurement)  Completed   Hepatitis C Screening  Completed   Zoster (Shingles) Vaccine  Completed   HPV Vaccine  Aged Out     -Flu vaccine in office today.     Chaya Jan, MD [facility] at [facility]

## 2023-02-14 ENCOUNTER — Encounter: Payer: Self-pay | Admitting: Internal Medicine

## 2023-02-14 ENCOUNTER — Other Ambulatory Visit: Payer: Self-pay | Admitting: Internal Medicine

## 2023-02-14 DIAGNOSIS — E1129 Type 2 diabetes mellitus with other diabetic kidney complication: Secondary | ICD-10-CM | POA: Insufficient documentation

## 2023-02-14 LAB — MICROALBUMIN / CREATININE URINE RATIO
Creatinine,U: 124.6 mg/dL
Microalb Creat Ratio: 1.8 mg/g (ref 0.0–30.0)
Microalb, Ur: 2.3 mg/dL — ABNORMAL HIGH (ref 0.0–1.9)

## 2023-02-14 MED ORDER — LISINOPRIL 10 MG PO TABS
10.0000 mg | ORAL_TABLET | Freq: Every day | ORAL | 1 refills | Status: DC
Start: 2023-02-14 — End: 2023-08-08

## 2023-02-22 ENCOUNTER — Ambulatory Visit (HOSPITAL_BASED_OUTPATIENT_CLINIC_OR_DEPARTMENT_OTHER)
Admission: RE | Admit: 2023-02-22 | Discharge: 2023-02-22 | Disposition: A | Discharge status: Home / Self Care | Payer: Medicare Other | Source: Ambulatory Visit | Attending: Internal Medicine | Admitting: Internal Medicine

## 2023-02-22 DIAGNOSIS — Z780 Asymptomatic menopausal state: Secondary | ICD-10-CM | POA: Insufficient documentation

## 2023-02-28 IMAGING — MG MM DIGITAL SCREENING BILAT W/ TOMO AND CAD
8 series · 8 of 24 positions shown · non-contrast
Comparison: Previous exam(s).

CLINICAL DATA: Screening.

EXAM:
DIGITAL SCREENING BILATERAL MAMMOGRAM WITH TOMOSYNTHESIS AND CAD
TECHNIQUE: Bilateral screening digital craniocaudal and mediolateral oblique
mammograms were obtained. Bilateral screening digital breast
tomosynthesis was performed. The images were evaluated with
computer-aided detection.

[R MLO synth-2D]
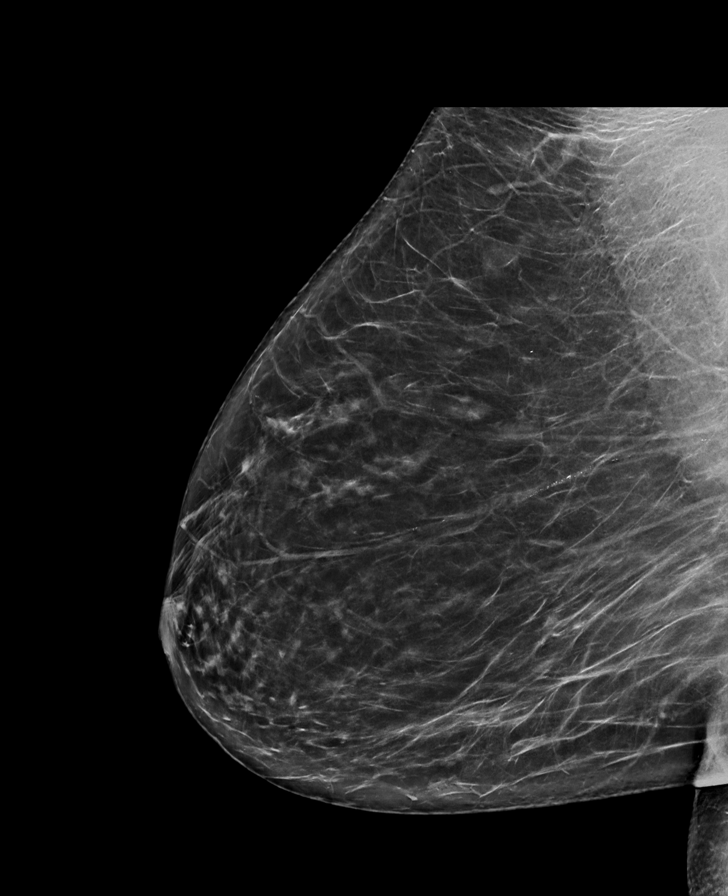

[R CC synth-2D]
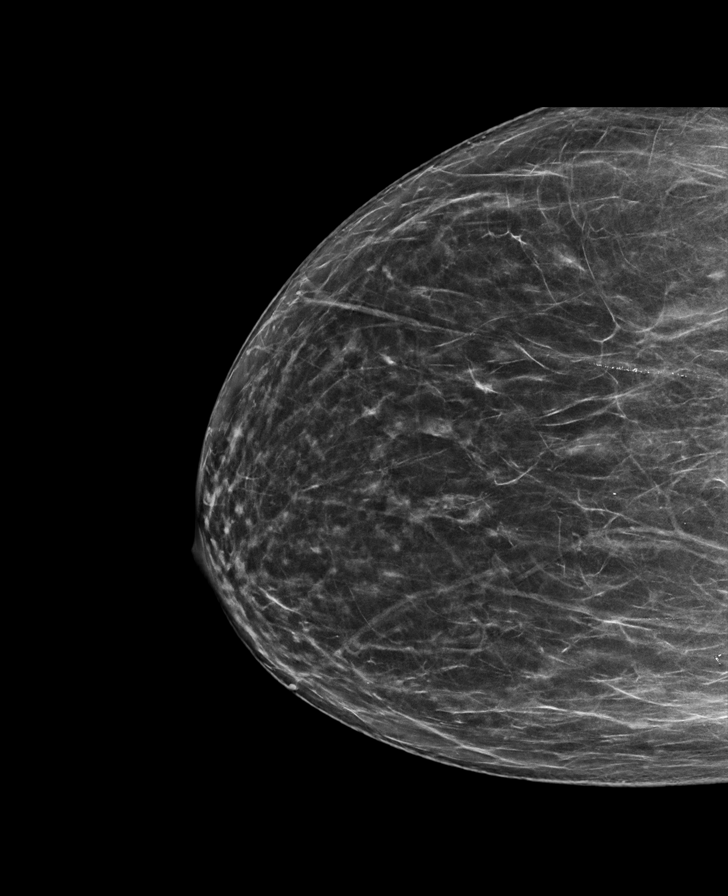

[L CC synth-2D]
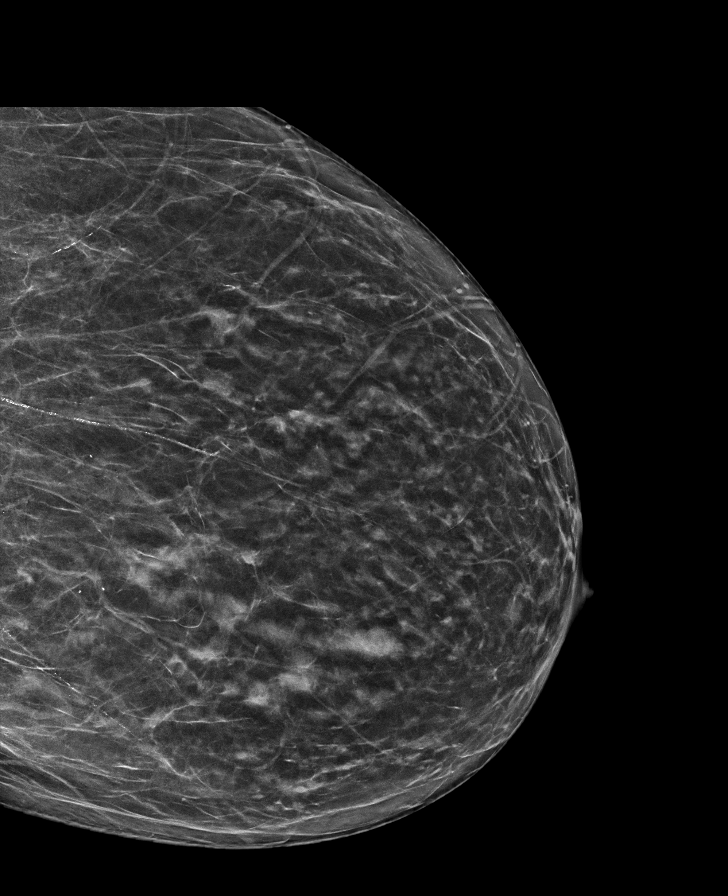

[L MLO synth-2D]
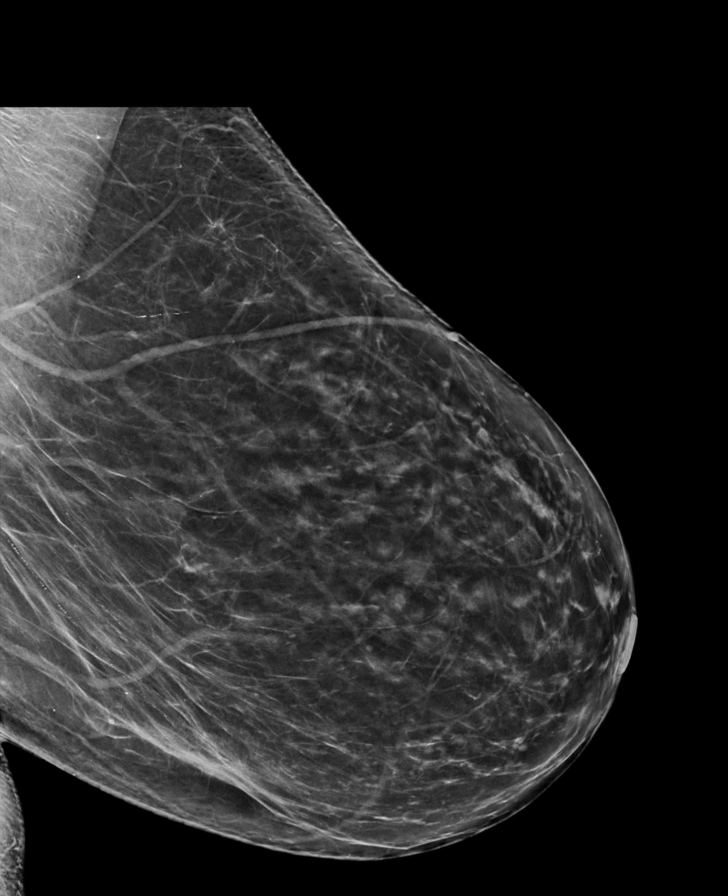

[L CC tomo · tomo slice 33/64.0]
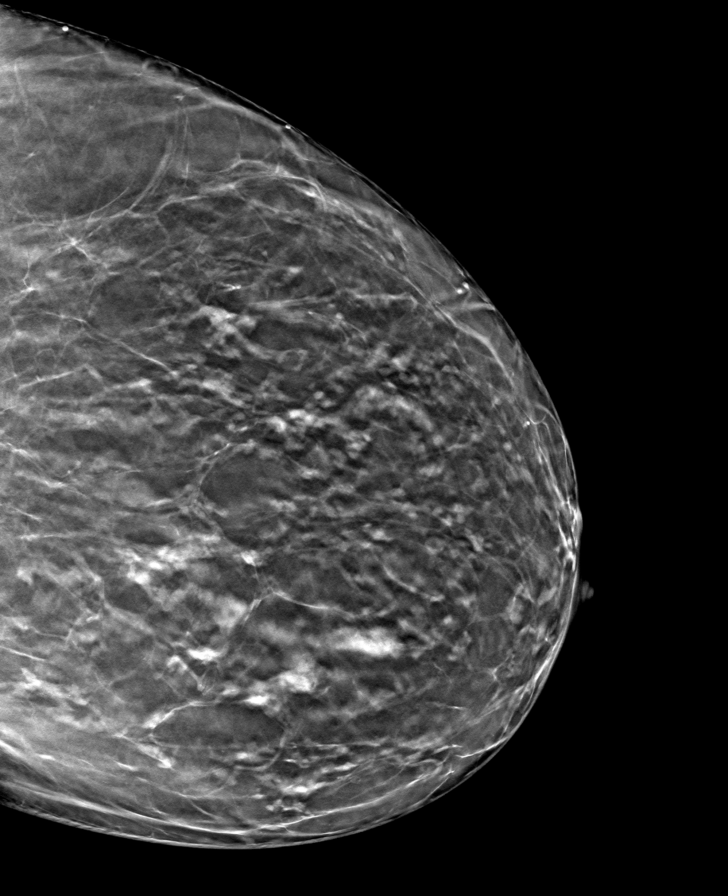

[L MLO tomo · tomo slice 39/78.0]
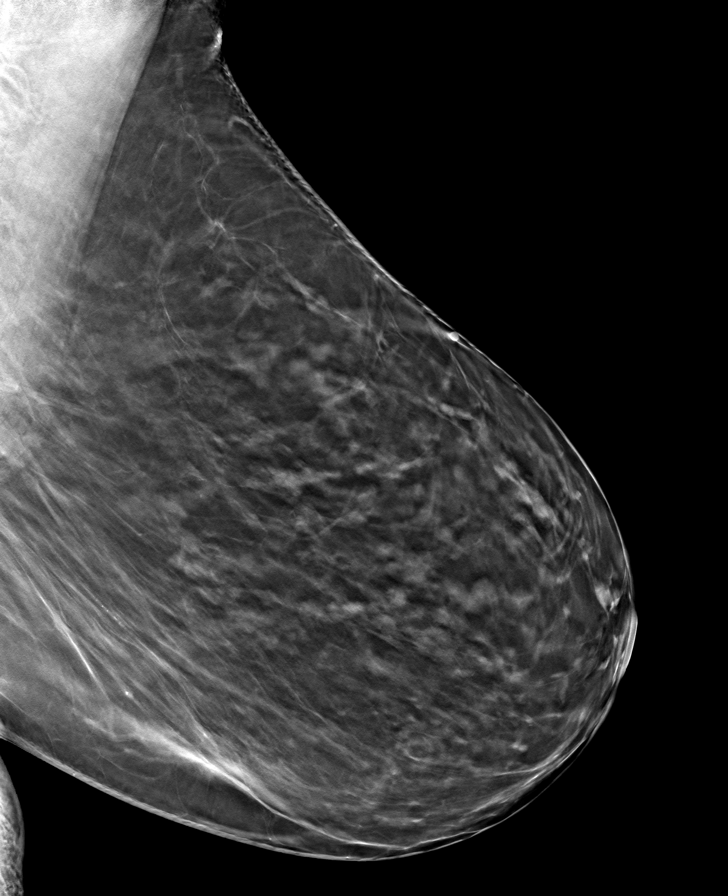

[R CC tomo · tomo slice 35/69.0]
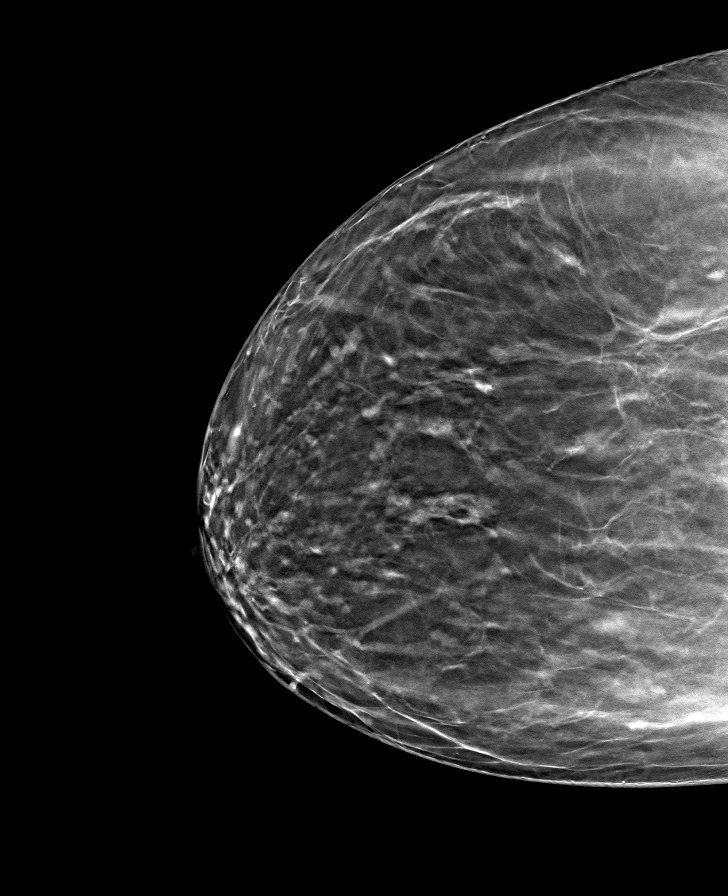

[R MLO tomo · tomo slice 41/80.0]
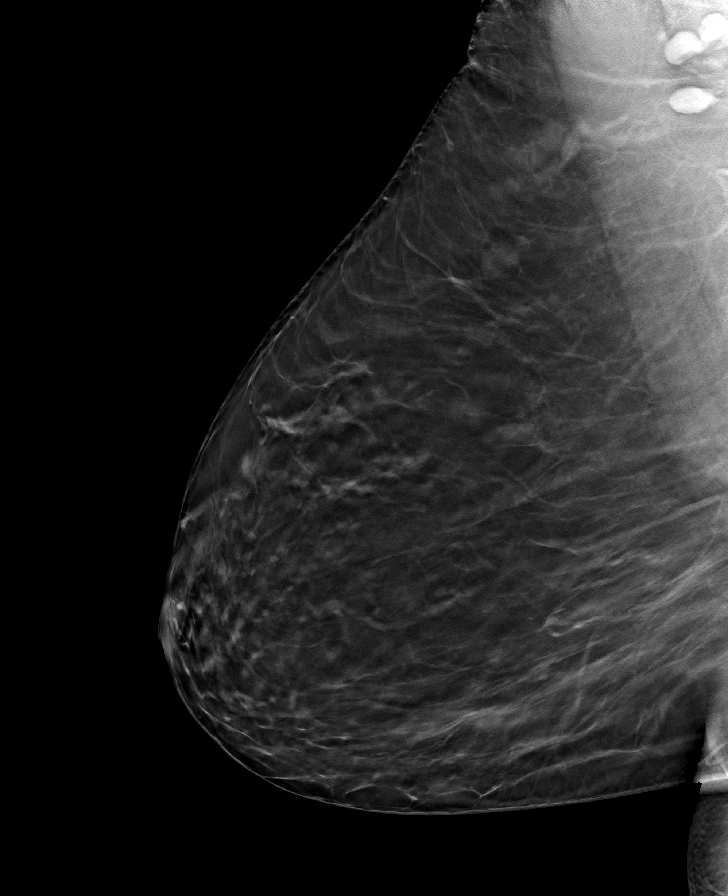

[8 of 24 positions shown; findings below may reference images not displayed]

ACR Breast Density Category b: There are scattered areas of
fibroglandular density.
FINDINGS: There are no findings suspicious for malignancy.
IMPRESSION: No mammographic evidence of malignancy. A result letter of this
screening mammogram will be mailed directly to the patient.

RECOMMENDATION:
Screening mammogram in one year. (Code:51-O-LD2)

BI-RADS CATEGORY  1: Negative.

## 2023-04-10 DIAGNOSIS — G8929 Other chronic pain: Secondary | ICD-10-CM | POA: Diagnosis not present

## 2023-05-11 ENCOUNTER — Other Ambulatory Visit: Payer: Self-pay | Admitting: Internal Medicine

## 2023-05-11 DIAGNOSIS — E1169 Type 2 diabetes mellitus with other specified complication: Secondary | ICD-10-CM

## 2023-06-15 ENCOUNTER — Ambulatory Visit: Payer: Medicare Other | Admitting: Internal Medicine

## 2023-06-15 ENCOUNTER — Encounter: Payer: Self-pay | Admitting: Internal Medicine

## 2023-06-15 VITALS — BP 110/70 | HR 94 | Temp 97.8°F | Wt 164.7 lb

## 2023-06-15 DIAGNOSIS — I10 Essential (primary) hypertension: Secondary | ICD-10-CM

## 2023-06-15 DIAGNOSIS — E1142 Type 2 diabetes mellitus with diabetic polyneuropathy: Secondary | ICD-10-CM | POA: Diagnosis not present

## 2023-06-15 DIAGNOSIS — E1169 Type 2 diabetes mellitus with other specified complication: Secondary | ICD-10-CM

## 2023-06-15 DIAGNOSIS — E785 Hyperlipidemia, unspecified: Secondary | ICD-10-CM | POA: Diagnosis not present

## 2023-06-15 LAB — HEMOGLOBIN A1C: Hgb A1c MFr Bld: 6.5 % (ref 4.6–6.5)

## 2023-06-15 NOTE — Progress Notes (Signed)
 Established Patient Office Visit     CC/Reason for Visit: Follow-up chronic medical conditions  HPI: Delpha Perko is a 73 y.o. female who is coming in today for the above mentioned reasons. Past Medical History is significant for: Hypertension, hyperlipidemia, type 2 diabetes with peripheral neuropathy, vitamin D  deficiency.  She is doing well and has no acute concerns or complaints today.   Past Medical/Surgical History: Past Medical History:  Diagnosis Date   Anxiety    Depression    DVT (deep venous thrombosis) (HCC)    History over 30 yrs ago, no further problems   HYPERLIPIDEMIA 06/05/2008   MENOPAUSAL SYNDROME 06/05/2008    Past Surgical History:  Procedure Laterality Date   ABDOMINAL HYSTERECTOMY     CESAREAN SECTION     x 2   COLONOSCOPY  2005   CYSTECTOMY     left elbow   LAPAROSCOPIC OVARIAN CYSTECTOMY      Social History:  reports that she has never smoked. She has never used smokeless tobacco. She reports that she does not drink alcohol and does not use drugs.  Allergies: No Known Allergies  Family History:  Family History  Problem Relation Age of Onset   Heart disease Father    Cancer Sister    Colon cancer Neg Hx    Breast cancer Neg Hx      Current Outpatient Medications:    atorvastatin  (LIPITOR) 40 MG tablet, TAKE 1 TABLET BY MOUTH EVERY DAY, Disp: 90 tablet, Rfl: 1   cholecalciferol (VITAMIN D ) 1000 UNITS tablet, Take 1,000 Units by mouth daily., Disp: , Rfl:    cromolyn (OPTICROM) 4 % ophthalmic solution, 1 drop 4 (four) times daily., Disp: , Rfl:    cyanocobalamin  (VITAMIN B12) 1000 MCG tablet, Take 1,000 mcg by mouth daily., Disp: , Rfl:    hydrochlorothiazide  (HYDRODIURIL ) 25 MG tablet, TAKE 1 TABLET BY MOUTH EVERY DAY, Disp: 90 tablet, Rfl: 0   lisinopril  (ZESTRIL ) 10 MG tablet, Take 1 tablet (10 mg total) by mouth daily., Disp: 90 tablet, Rfl: 1   metFORMIN  (GLUCOPHAGE ) 500 MG tablet, TAKE 1 TABLET BY MOUTH EVERYDAY AT  BEDTIME, Disp: 90 tablet, Rfl: 1   Multiple Vitamin (MULTIVITAMIN) tablet, Take 1 tablet by mouth daily., Disp: , Rfl:   Review of Systems:  Negative unless indicated in HPI.   Physical Exam: Vitals:   06/15/23 1013  BP: 110/70  Pulse: 94  Temp: 97.8 F (36.6 C)  TempSrc: Oral  SpO2: 98%  Weight: 164 lb 11.2 oz (74.7 kg)    Body mass index is 26.58 kg/m.   Physical Exam Vitals reviewed.  Constitutional:      Appearance: Normal appearance.  HENT:     Head: Normocephalic and atraumatic.  Eyes:     Conjunctiva/sclera: Conjunctivae normal.     Pupils: Pupils are equal, round, and reactive to light.  Cardiovascular:     Rate and Rhythm: Normal rate and regular rhythm.  Pulmonary:     Effort: Pulmonary effort is normal.     Breath sounds: Normal breath sounds.  Skin:    General: Skin is warm and dry.  Neurological:     General: No focal deficit present.     Mental Status: She is alert and oriented to person, place, and time.  Psychiatric:        Mood and Affect: Mood normal.        Behavior: Behavior normal.        Thought Content: Thought  content normal.        Judgment: Judgment normal.      Impression and Plan:  Type 2 diabetes mellitus with diabetic polyneuropathy, without long-term current use of insulin (HCC) -     Hemoglobin A1c; Future  Hyperlipidemia associated with type 2 diabetes mellitus (HCC)  Essential hypertension  -Check lab A1c today, will follow-up with patient regarding results. -Blood pressure is well-controlled. -LDL is at goal at 64.  Continue atorvastatin  40 mg daily.   Time spent:30 minutes reviewing chart, interviewing and examining patient and formulating plan of care.     Tully Theophilus Andrews, MD [facility] at [facility]

## 2023-06-19 ENCOUNTER — Encounter: Payer: Self-pay | Admitting: Internal Medicine

## 2023-07-03 DIAGNOSIS — L02221 Furuncle of abdominal wall: Secondary | ICD-10-CM | POA: Diagnosis not present

## 2023-07-03 DIAGNOSIS — L814 Other melanin hyperpigmentation: Secondary | ICD-10-CM | POA: Diagnosis not present

## 2023-07-03 DIAGNOSIS — D225 Melanocytic nevi of trunk: Secondary | ICD-10-CM | POA: Diagnosis not present

## 2023-07-03 DIAGNOSIS — L821 Other seborrheic keratosis: Secondary | ICD-10-CM | POA: Diagnosis not present

## 2023-07-04 DIAGNOSIS — H903 Sensorineural hearing loss, bilateral: Secondary | ICD-10-CM | POA: Diagnosis not present

## 2023-07-25 ENCOUNTER — Other Ambulatory Visit: Payer: Self-pay | Admitting: Internal Medicine

## 2023-07-25 DIAGNOSIS — E785 Hyperlipidemia, unspecified: Secondary | ICD-10-CM

## 2023-07-31 DIAGNOSIS — Z961 Presence of intraocular lens: Secondary | ICD-10-CM | POA: Diagnosis not present

## 2023-07-31 DIAGNOSIS — H16223 Keratoconjunctivitis sicca, not specified as Sjogren's, bilateral: Secondary | ICD-10-CM | POA: Diagnosis not present

## 2023-07-31 DIAGNOSIS — H26491 Other secondary cataract, right eye: Secondary | ICD-10-CM | POA: Diagnosis not present

## 2023-07-31 LAB — HM DIABETES EYE EXAM

## 2023-08-07 ENCOUNTER — Other Ambulatory Visit: Payer: Self-pay | Admitting: Internal Medicine

## 2023-08-07 DIAGNOSIS — E1129 Type 2 diabetes mellitus with other diabetic kidney complication: Secondary | ICD-10-CM

## 2023-08-15 ENCOUNTER — Other Ambulatory Visit: Payer: Self-pay | Admitting: Internal Medicine

## 2023-08-15 DIAGNOSIS — I10 Essential (primary) hypertension: Secondary | ICD-10-CM

## 2023-08-24 DIAGNOSIS — M9903 Segmental and somatic dysfunction of lumbar region: Secondary | ICD-10-CM | POA: Diagnosis not present

## 2023-08-24 DIAGNOSIS — M9905 Segmental and somatic dysfunction of pelvic region: Secondary | ICD-10-CM | POA: Diagnosis not present

## 2023-08-24 DIAGNOSIS — M9904 Segmental and somatic dysfunction of sacral region: Secondary | ICD-10-CM | POA: Diagnosis not present

## 2023-08-24 DIAGNOSIS — M5136 Other intervertebral disc degeneration, lumbar region with discogenic back pain only: Secondary | ICD-10-CM | POA: Diagnosis not present

## 2023-08-28 DIAGNOSIS — M9905 Segmental and somatic dysfunction of pelvic region: Secondary | ICD-10-CM | POA: Diagnosis not present

## 2023-08-28 DIAGNOSIS — M9903 Segmental and somatic dysfunction of lumbar region: Secondary | ICD-10-CM | POA: Diagnosis not present

## 2023-08-28 DIAGNOSIS — M9904 Segmental and somatic dysfunction of sacral region: Secondary | ICD-10-CM | POA: Diagnosis not present

## 2023-08-28 DIAGNOSIS — M5136 Other intervertebral disc degeneration, lumbar region with discogenic back pain only: Secondary | ICD-10-CM | POA: Diagnosis not present

## 2023-09-12 ENCOUNTER — Other Ambulatory Visit: Payer: Self-pay | Admitting: Internal Medicine

## 2023-09-12 DIAGNOSIS — Z1231 Encounter for screening mammogram for malignant neoplasm of breast: Secondary | ICD-10-CM

## 2023-10-06 DIAGNOSIS — H26491 Other secondary cataract, right eye: Secondary | ICD-10-CM | POA: Diagnosis not present

## 2023-10-30 ENCOUNTER — Ambulatory Visit (INDEPENDENT_AMBULATORY_CARE_PROVIDER_SITE_OTHER): Payer: Medicare Other | Admitting: Internal Medicine

## 2023-10-30 ENCOUNTER — Encounter: Payer: Self-pay | Admitting: Internal Medicine

## 2023-10-30 VITALS — BP 110/62 | HR 75 | Temp 98.7°F | Ht 66.0 in | Wt 164.1 lb

## 2023-10-30 DIAGNOSIS — E1169 Type 2 diabetes mellitus with other specified complication: Secondary | ICD-10-CM

## 2023-10-30 DIAGNOSIS — E1129 Type 2 diabetes mellitus with other diabetic kidney complication: Secondary | ICD-10-CM | POA: Diagnosis not present

## 2023-10-30 DIAGNOSIS — I10 Essential (primary) hypertension: Secondary | ICD-10-CM | POA: Diagnosis not present

## 2023-10-30 DIAGNOSIS — E785 Hyperlipidemia, unspecified: Secondary | ICD-10-CM | POA: Diagnosis not present

## 2023-10-30 DIAGNOSIS — R809 Proteinuria, unspecified: Secondary | ICD-10-CM | POA: Diagnosis not present

## 2023-10-30 DIAGNOSIS — E559 Vitamin D deficiency, unspecified: Secondary | ICD-10-CM

## 2023-10-30 DIAGNOSIS — E1142 Type 2 diabetes mellitus with diabetic polyneuropathy: Secondary | ICD-10-CM

## 2023-10-30 LAB — POCT GLYCOSYLATED HEMOGLOBIN (HGB A1C): Hemoglobin A1C: 6.2 % — AB (ref 4.0–5.6)

## 2023-10-30 NOTE — Assessment & Plan Note (Signed)
Well-controlled with an A1c of 6.2.

## 2023-10-30 NOTE — Assessment & Plan Note (Signed)
 Well-controlled on current.

## 2023-10-30 NOTE — Assessment & Plan Note (Addendum)
 LDL at goal at 64 at last check in October 2024.  Continue atorvastatin  40 mg daily.

## 2023-10-30 NOTE — Assessment & Plan Note (Signed)
On lisinopril

## 2023-10-30 NOTE — Progress Notes (Signed)
     Established Patient Office Visit     CC/Reason for Visit: Follow-up chronic conditions  HPI: Danielle Morse is a 73 y.o. female who is coming in today for the above mentioned reasons. Past Medical History is significant for: Hypertension, hyperlipidemia, type 2 diabetes with peripheral neuropathy and microalbuminuria, vitamin D  deficiency.  Feeling well without acute concerns or complaints.   Past Medical/Surgical History: Past Medical History:  Diagnosis Date   Anxiety    Depression    DVT (deep venous thrombosis) (HCC)    History over 30 yrs ago, no further problems   HYPERLIPIDEMIA 06/05/2008   MENOPAUSAL SYNDROME 06/05/2008    Past Surgical History:  Procedure Laterality Date   ABDOMINAL HYSTERECTOMY     CESAREAN SECTION     x 2   COLONOSCOPY  2005   CYSTECTOMY     left elbow   LAPAROSCOPIC OVARIAN CYSTECTOMY      Social History:  reports that she has never smoked. She has never used smokeless tobacco. She reports that she does not drink alcohol and does not use drugs.  Allergies: No Known Allergies  Family History:  Family History  Problem Relation Age of Onset   Heart disease Father    Cancer Sister    Colon cancer Neg Hx    Breast cancer Neg Hx      Current Outpatient Medications:    atorvastatin  (LIPITOR) 40 MG tablet, TAKE 1 TABLET BY MOUTH EVERY DAY, Disp: 90 tablet, Rfl: 1   cholecalciferol (VITAMIN D ) 1000 UNITS tablet, Take 1,000 Units by mouth daily., Disp: , Rfl:    cromolyn (OPTICROM) 4 % ophthalmic solution, 1 drop 4 (four) times daily., Disp: , Rfl:    cyanocobalamin  (VITAMIN B12) 1000 MCG tablet, Take 1,000 mcg by mouth daily., Disp: , Rfl:    hydrochlorothiazide  (HYDRODIURIL ) 25 MG tablet, TAKE 1 TABLET BY MOUTH EVERY DAY, Disp: 90 tablet, Rfl: 1   lisinopril  (ZESTRIL ) 10 MG tablet, TAKE 1 TABLET BY MOUTH EVERY DAY, Disp: 90 tablet, Rfl: 1   metFORMIN  (GLUCOPHAGE ) 500 MG tablet, TAKE 1 TABLET BY MOUTH EVERYDAY AT BEDTIME, Disp:  90 tablet, Rfl: 1   Multiple Vitamin (MULTIVITAMIN) tablet, Take 1 tablet by mouth daily., Disp: , Rfl:   Review of Systems:  Negative unless indicated in HPI.   Physical Exam: Vitals:   10/30/23 0943  BP: 110/62  Pulse: 75  Temp: 98.7 F (37.1 C)  TempSrc: Oral  SpO2: 98%  Weight: 164 lb 1.6 oz (74.4 kg)  Height: 5' 6 (1.676 m)    Body mass index is 26.49 kg/m.   Physical Exam   Impression and Plan:  Type 2 diabetes mellitus with diabetic polyneuropathy, without long-term current use of insulin (HCC) Assessment & Plan: Well controlled with an A1c of 6.2.  Orders: -     POCT glycosylated hemoglobin (Hb A1C)  Microalbuminuria due to type 2 diabetes mellitus (HCC) Assessment & Plan: On lisinopril .   Hyperlipidemia associated with type 2 diabetes mellitus [facility]) Assessment & Plan: LDL at goal at 64 at last check in October 2024.  Continue atorvastatin  40 mg daily.   Essential hypertension Assessment & Plan: Well-controlled on current.   Vitamin D  deficiency     Time spent:31 minutes reviewing chart, interviewing and examining patient and formulating plan of care.     Tully Theophilus Andrews, MD [facility] [facility] at [facility]

## 2023-11-16 ENCOUNTER — Ambulatory Visit
Admission: RE | Admit: 2023-11-16 | Discharge: 2023-11-16 | Disposition: A | Discharge status: Home / Self Care | Source: Ambulatory Visit | Attending: Internal Medicine | Admitting: Internal Medicine

## 2023-11-16 DIAGNOSIS — Z1231 Encounter for screening mammogram for malignant neoplasm of breast: Secondary | ICD-10-CM | POA: Diagnosis not present

## 2024-01-04 ENCOUNTER — Other Ambulatory Visit: Payer: Self-pay | Admitting: Internal Medicine

## 2024-01-04 DIAGNOSIS — E785 Hyperlipidemia, unspecified: Secondary | ICD-10-CM

## 2024-01-28 ENCOUNTER — Other Ambulatory Visit: Payer: Self-pay | Admitting: Internal Medicine

## 2024-01-28 DIAGNOSIS — I10 Essential (primary) hypertension: Secondary | ICD-10-CM

## 2024-01-28 DIAGNOSIS — E1169 Type 2 diabetes mellitus with other specified complication: Secondary | ICD-10-CM

## 2024-01-28 DIAGNOSIS — E1129 Type 2 diabetes mellitus with other diabetic kidney complication: Secondary | ICD-10-CM

## 2024-02-06 ENCOUNTER — Ambulatory Visit

## 2024-02-06 VITALS — Ht 66.0 in | Wt 164.0 lb

## 2024-02-06 DIAGNOSIS — Z0000 Encounter for general adult medical examination without abnormal findings: Secondary | ICD-10-CM | POA: Diagnosis not present

## 2024-02-06 DIAGNOSIS — R809 Proteinuria, unspecified: Secondary | ICD-10-CM | POA: Diagnosis not present

## 2024-02-06 DIAGNOSIS — E1129 Type 2 diabetes mellitus with other diabetic kidney complication: Secondary | ICD-10-CM | POA: Diagnosis not present

## 2024-02-06 NOTE — Patient Instructions (Signed)
 [name],  Thank you for taking the time for your Medicare Wellness Visit. I appreciate your continued commitment to your health goals. Please review the care plan we discussed, and feel free to reach out if I can assist you further.  Medicare recommends these wellness visits once per year to help you and your care team stay ahead of potential health issues. These visits are designed to focus on prevention, allowing your provider to concentrate on managing your acute and chronic conditions during your regular appointments.  Please note that Annual Wellness Visits do not include a physical exam. Some assessments may be limited, especially if the visit was conducted virtually. If needed, we may recommend a separate in-person follow-up with your provider.  Ongoing Care Seeing your primary care provider every 3 to 6 months helps us  monitor your health and provide consistent, personalized care. Last office visit on 10/30/2023.  Next office visit on 02/29/2024.  You are due for a flu vaccine and a kidney evaluation, which can be done during your next office visit here.    Referrals If a referral was made during today's visit and you haven't received any updates within two weeks, please contact the referred provider directly to check on the status.  Recommended Screenings:  Health Maintenance  Topic Date Due   Yearly kidney health urinalysis for diabetes  Never done   Flu Shot  12/08/2023   COVID-19 Vaccine (7 - 2025-26 season) 01/08/2024   Yearly kidney function blood test for diabetes  02/13/2024   Complete foot exam   02/13/2024   Hemoglobin A1C  04/30/2024   Eye exam for diabetics  07/30/2024   Breast Cancer Screening  11/15/2024   Colon Cancer Screening  12/22/2024   Medicare Annual Wellness Visit  02/05/2025   DTaP/Tdap/Td vaccine (3 - Td or Tdap) 02/09/2032   Pneumococcal Vaccine for age over 51  Completed   DEXA scan (bone density measurement)  Completed   Hepatitis C Screening   Completed   Zoster (Shingles) Vaccine  Completed   HPV Vaccine  Aged Out   Meningitis B Vaccine  Aged Out       02/06/2024    1:09 PM  Advanced Directives  Does Patient Have a Medical Advance Directive? No  Would patient like information on creating a medical advance directive? No - Patient declined   Advance Care Planning is important because it: Ensures you receive medical care that aligns with your values, goals, and preferences. Provides guidance to your family and loved ones, reducing the emotional burden of decision-making during critical moments.  Vision: Annual vision screenings are recommended for early detection of glaucoma, cataracts, and diabetic retinopathy. These exams can also reveal signs of chronic conditions such as diabetes and high blood pressure.  Dental: Annual dental screenings help detect early signs of oral cancer, gum disease, and other conditions linked to overall health, including heart disease and diabetes.  Please see the attached documents for additional preventive care recommendations.

## 2024-02-06 NOTE — Progress Notes (Signed)
 Subjective:   Danielle Morse is a 73 y.o. who presents for a Medicare Wellness preventive visit.  As a reminder, Annual Wellness Visits don't include a physical exam, and some assessments may be limited, especially if this visit is performed virtually. We may recommend an in-person follow-up visit with your provider if needed.  Visit Complete: Virtual I connected with  Danielle Morse on 02/06/24 by a audio enabled telemedicine application and verified that I am speaking with the correct person using two identifiers.  Patient Location: Home  Provider Location: Home Office  I discussed the limitations of evaluation and management by telemedicine. The patient expressed understanding and agreed to proceed.  Vital Signs: Because this visit was a virtual/telehealth visit, some criteria may be missing or patient reported. Any vitals not documented were not able to be obtained and vitals that have been documented are patient reported.  VideoDeclined- This patient declined Librarian, academic. Therefore the visit was completed with audio only.  Persons Participating in Visit: Patient.  AWV Questionnaire: No: Patient Medicare AWV questionnaire was not completed prior to this visit.  Cardiac Risk Factors include: advanced age (>70men, >83 women);hypertension;diabetes mellitus;dyslipidemia     Objective:    Today's Vitals   02/06/24 1304  Weight: 164 lb (74.4 kg)  Height: 5' 6 (1.676 m)   Body mass index is 26.47 kg/m.     02/06/2024    1:09 PM 02/08/2023    1:29 PM 11/20/2018    3:38 PM 12/09/2014   10:01 AM  Advanced Directives  Does Patient Have a Medical Advance Directive? No No No No   Would patient like information on creating a medical advance directive? No - Patient declined No - Patient declined No - Patient declined  No - patient declined information      Data saved with a previous flowsheet row definition    Current Medications  (verified) Outpatient Encounter Medications as of 02/06/2024  Medication Sig   atorvastatin  (LIPITOR) 40 MG tablet TAKE 1 TABLET BY MOUTH EVERY DAY   cholecalciferol (VITAMIN D ) 1000 UNITS tablet Take 1,000 Units by mouth daily.   cromolyn (OPTICROM) 4 % ophthalmic solution 1 drop 4 (four) times daily.   cyanocobalamin  (VITAMIN B12) 1000 MCG tablet Take 1,000 mcg by mouth daily.   hydrochlorothiazide  (HYDRODIURIL ) 25 MG tablet TAKE 1 TABLET BY MOUTH EVERY DAY   lisinopril  (ZESTRIL ) 10 MG tablet TAKE 1 TABLET BY MOUTH EVERY DAY   metFORMIN  (GLUCOPHAGE ) 500 MG tablet TAKE 1 TABLET BY MOUTH EVERYDAY AT BEDTIME   Multiple Vitamin (MULTIVITAMIN) tablet Take 1 tablet by mouth daily.   No facility-administered encounter medications on file as of 02/06/2024.    Allergies (verified) Patient has no known allergies.   History: Past Medical History:  Diagnosis Date   Anxiety    Depression    DVT (deep venous thrombosis) (HCC)    History over 30 yrs ago, no further problems   HYPERLIPIDEMIA 06/05/2008   MENOPAUSAL SYNDROME 06/05/2008   Past Surgical History:  Procedure Laterality Date   ABDOMINAL HYSTERECTOMY     CESAREAN SECTION     x 2   COLONOSCOPY  2005   CYSTECTOMY     left elbow   LAPAROSCOPIC OVARIAN CYSTECTOMY     Family History  Problem Relation Age of Onset   Heart disease Father    Cancer Sister    Colon cancer Neg Hx    Breast cancer Neg Hx    Social History  Socioeconomic History   Marital status: Single    Spouse name: Not on file   Number of children: 2   Years of education: Not on file   Highest education level: Not on file  Occupational History   Occupation: RETIRED  Tobacco Use   Smoking status: Never   Smokeless tobacco: Never  Vaping Use   Vaping status: Never Used  Substance and Sexual Activity   Alcohol use: No    Alcohol/week: 0.0 standard drinks of alcohol   Drug use: No   Sexual activity: Not on file  Other Topics Concern   Not on file   Social History Narrative   Lives with a partner/2025   Social Drivers of Health   Financial Resource Strain: Low Risk  (02/08/2023)   Overall Financial Resource Strain (CARDIA)    Difficulty of Paying Living Expenses: Not hard at all  Food Insecurity: No Food Insecurity (02/08/2023)   Hunger Vital Sign    Worried About Running Out of Food in the Last Year: Never true    Ran Out of Food in the Last Year: Never true  Transportation Needs: No Transportation Needs (02/06/2024)   PRAPARE - Administrator, Civil Service (Medical): No    Lack of Transportation (Non-Medical): No  Physical Activity: Insufficiently Active (02/06/2024)   Exercise Vital Sign    Days of Exercise per Week: 3 days    Minutes of Exercise per Session: 20 min  Stress: No Stress Concern Present (02/06/2024)   [employer] of Occupational Health - Occupational Stress Questionnaire    Feeling of Stress: Not at all  Social Connections: Moderately Isolated (02/06/2024)   Social Connection and Isolation Panel    Frequency of Communication with Friends and Family: Three times a week    Frequency of Social Gatherings with Friends and Family: Once a week    Attends Religious Services: Never    Database administrator or Organizations: No    Attends Engineer, structural: Never    Marital Status: Living with partner    Tobacco Counseling Counseling given: Not Answered    Clinical Intake:  Pre-visit preparation completed: Yes  Pain : No/denies pain     BMI - recorded: 26.47 Nutritional Status: BMI 25 -29 Overweight Nutritional Risks: None Diabetes: Yes CBG done?: No Did pt. bring in CBG monitor from home?: No  Lab Results  Component Value Date   HGBA1C 6.2 (A) 10/30/2023   HGBA1C 6.5 06/15/2023   HGBA1C 6.4 02/13/2023     How often do you need to have someone help you when you read instructions, pamphlets, or other written materials from your doctor or pharmacy?: 1 -  Never  Interpreter Needed?: No  Information entered by :: Sandar Krinke, RMA   Activities of Daily Living     02/06/2024    1:06 PM 02/08/2023    1:27 PM  In your present state of health, do you have any difficulty performing the following activities:  Hearing? 0 0  Vision? 0 0  Difficulty concentrating or making decisions? 0 0  Walking or climbing stairs? 0 0  Dressing or bathing? 0 0  Doing errands, shopping? 0 0  Preparing Food and eating ? N N  Using the Toilet? N N  In the past six months, have you accidently leaked urine? N N  Do you have problems with loss of bowel control? N N  Managing your Medications? N N  Managing your Finances? N N  Housekeeping  or managing your Housekeeping? N N    Patient Care Team: Theophilus Andrews, Tully GRADE, MD as PCP - General (Internal Medicine)  I have updated your Care Teams any recent Medical Services you may have received from other providers in the past year.     Assessment:   This is a routine wellness examination for Marzelle.  Hearing/Vision screen Hearing Screening - Comments:: Denies hearing difficulties   Vision Screening - Comments:: Wears eyeglasses/Dr. JAYSON Gaudy   Goals Addressed               This Visit's Progress     Patient Stated (pt-stated)        Not at this time/2025       Depression Screen     02/06/2024    1:11 PM 06/15/2023   10:15 AM 02/08/2023    1:46 PM 02/08/2023    1:29 PM 02/08/2023    1:26 PM 10/13/2022   11:03 AM 06/14/2022   10:59 AM  PHQ 2/9 Scores  PHQ - 2 Score 3 0 0 0 0 0 0  PHQ- 9 Score 3  0   0 0    Fall Risk     02/06/2024    1:09 PM 06/15/2023   10:15 AM 02/08/2023    1:29 PM 10/13/2022   11:03 AM 06/14/2022   10:59 AM  Fall Risk   Falls in the past year? 0 0 0 0 0  Number falls in past yr: 0 0 0 0 0  Injury with Fall? 0 0 0 0 0  Risk for fall due to :     No Fall Risks  Follow up Falls evaluation completed;Falls prevention discussed Falls evaluation completed Falls evaluation  completed Falls evaluation completed Falls evaluation completed    MEDICARE RISK AT HOME:  Medicare Risk at Home Any stairs in or around the home?: Yes If so, are there any without handrails?: No Home free of loose throw rugs in walkways, pet beds, electrical cords, etc?: Yes Adequate lighting in your home to reduce risk of falls?: Yes Life alert?: No Use of a cane, walker or w/c?: No Grab bars in the bathroom?: Yes Shower chair or bench in shower?: No Elevated toilet seat or a handicapped toilet?: No  TIMED UP AND GO:  Was the test performed?  No  Cognitive Function: Declined/Normal: No cognitive concerns noted by patient or family. Patient alert, oriented, able to answer questions appropriately and recall recent events. No signs of memory loss or confusion.        02/08/2023    1:29 PM  6CIT Screen  What Year? 0 points  What month? 0 points  What time? 0 points  Count back from 20 0 points  Months in reverse 0 points  Repeat phrase 0 points  Total Score 0 points    Immunizations Immunization History  Administered Date(s) Administered   Fluad Quad(high Dose 65+) 03/14/2019, 01/28/2020, 02/04/2021, 02/07/2022   Fluad Trivalent(High Dose 65+) 02/13/2023   INFLUENZA, HIGH DOSE SEASONAL PF 03/24/2017, 03/15/2018   Influenza,inj,Quad PF,6+ Mos 02/19/2014, 05/26/2015, 04/05/2016   PFIZER(Purple Top)SARS-COV-2 Vaccination 06/30/2019, 07/22/2019, 03/20/2020, 08/08/2020, 01/22/2021   Pfizer Covid-19 Vaccine Bivalent Booster 61yrs & up 02/14/2022   Pfizer(Comirnaty)Fall Seasonal Vaccine 12 years and older 02/14/2022   Pneumococcal Conjugate-13 12/11/2017   Pneumococcal Polysaccharide-23 12/13/2018   Respiratory Syncytial Virus Vaccine,Recomb Aduvanted(Arexvy) 07/21/2022   Tdap 07/29/2011, 02/08/2022   Zoster Recombinant(Shingrix) 12/14/2018, 02/01/2019, 09/05/2019   Zoster, Live 07/29/2011    Screening Tests  Health Maintenance  Topic Date Due   Diabetic kidney evaluation  - Urine ACR  Never done   Influenza Vaccine  12/08/2023   COVID-19 Vaccine (7 - 2025-26 season) 01/08/2024   Diabetic kidney evaluation - eGFR measurement  02/13/2024   FOOT EXAM  02/13/2024   HEMOGLOBIN A1C  04/30/2024   OPHTHALMOLOGY EXAM  07/30/2024   Mammogram  11/15/2024   Colonoscopy  12/22/2024   Medicare Annual Wellness (AWV)  02/05/2025   DTaP/Tdap/Td (3 - Td or Tdap) 02/09/2032   Pneumococcal Vaccine: 50+ Years  Completed   DEXA SCAN  Completed   Hepatitis C Screening  Completed   Zoster Vaccines- Shingrix  Completed   HPV VACCINES  Aged Out   Meningococcal B Vaccine  Aged Out    Health Maintenance Items Addressed: See Nurse Notes at the end of this note  Additional Screening:  Vision Screening: Recommended annual ophthalmology exams for early detection of glaucoma and other disorders of the eye. Is the patient up to date with their annual eye exam?  No  Who is the provider or what is the name of the office in which the patient attends annual eye exams? Dr. KYM Gaudy  Dental Screening: Recommended annual dental exams for proper oral hygiene  Community Resource Referral / Chronic Care Management: CRR required this visit?  No   CCM required this visit?  No   Plan:    I have personally reviewed and noted the following in the patient's chart:   Medical and social history Use of alcohol, tobacco or illicit drugs  Current medications and supplements including opioid prescriptions. Patient is not currently taking opioid prescriptions. Functional ability and status Nutritional status Physical activity Advanced directives List of other physicians Hospitalizations, surgeries, and ER visits in previous 12 months Vitals Screenings to include cognitive, depression, and falls Referrals and appointments  In addition, I have reviewed and discussed with patient certain preventive protocols, quality metrics, and best practice recommendations. A written personalized care  plan for preventive services as well as general preventive health recommendations were provided to patient.   Solomiya Pascale L Dariel Betzer, CMA   02/06/2024   After Visit Summary: (MyChart) Due to this being a telephonic visit, the after visit summary with patients personalized plan was offered to patient via MyChart   Notes: Patient is due for a flu vaccine and a kidney evaluation, which will be done here during her next office visit.  Orders have been placed.  She had no other concerns to address today.

## 2024-02-27 NOTE — Progress Notes (Addendum)
 Danielle Morse                                          MRN: [id number]   02/27/2024   The [facility] Quality Team Specialist reviewed this patient medical record for the purposes of chart review for care gap closure. The following were reviewed: abstraction for care gap closure-glycemic status assessment. Chart reviewed also for KED labs.  04/16/2024- No eGFR    VBCI Quality Team

## 2024-02-29 ENCOUNTER — Ambulatory Visit: Admitting: Internal Medicine

## 2024-02-29 ENCOUNTER — Encounter: Payer: Self-pay | Admitting: Internal Medicine

## 2024-02-29 VITALS — BP 128/78 | Temp 97.5°F | Wt 163.8 lb

## 2024-02-29 DIAGNOSIS — R809 Proteinuria, unspecified: Secondary | ICD-10-CM | POA: Diagnosis not present

## 2024-02-29 DIAGNOSIS — E1159 Type 2 diabetes mellitus with other circulatory complications: Secondary | ICD-10-CM | POA: Diagnosis not present

## 2024-02-29 DIAGNOSIS — I152 Hypertension secondary to endocrine disorders: Secondary | ICD-10-CM

## 2024-02-29 DIAGNOSIS — Z23 Encounter for immunization: Secondary | ICD-10-CM | POA: Diagnosis not present

## 2024-02-29 DIAGNOSIS — E1169 Type 2 diabetes mellitus with other specified complication: Secondary | ICD-10-CM

## 2024-02-29 DIAGNOSIS — E785 Hyperlipidemia, unspecified: Secondary | ICD-10-CM | POA: Diagnosis not present

## 2024-02-29 DIAGNOSIS — E1129 Type 2 diabetes mellitus with other diabetic kidney complication: Secondary | ICD-10-CM

## 2024-02-29 LAB — POCT GLYCOSYLATED HEMOGLOBIN (HGB A1C): Hemoglobin A1C: 6.1 % — AB (ref 4.0–5.6)

## 2024-02-29 LAB — MICROALBUMIN / CREATININE URINE RATIO
Creatinine,U: 54.5 mg/dL
Microalb Creat Ratio: UNDETERMINED mg/g (ref 0.0–30.0)
Microalb, Ur: <0.7 mg/dL

## 2024-02-29 NOTE — Assessment & Plan Note (Signed)
 LDL at goal at 64 at last check in October 2024.  Continue atorvastatin  40 mg daily.

## 2024-02-29 NOTE — Assessment & Plan Note (Signed)
Well controlled with an A1c of 6.0. 

## 2024-02-29 NOTE — Assessment & Plan Note (Signed)
 Well-controlled on current.

## 2024-02-29 NOTE — Progress Notes (Signed)
 Established Patient Office Visit     CC/Reason for Visit: Follow-up chronic medical conditions  HPI: Danielle Morse is a 73 y.o. female who is coming in today for the above mentioned reasons. Past Medical History is significant for: Hypertension, hyperlipidemia, vitamin D  deficiency, type 2 diabetes with peripheral neuropathy and microalbuminuria.  Feeling well, no concerns or complaints.  Requesting flu vaccine.   Past Medical/Surgical History: Past Medical History:  Diagnosis Date   Anxiety    Depression    DVT (deep venous thrombosis) (HCC)    History over 30 yrs ago, no further problems   HYPERLIPIDEMIA 06/05/2008   MENOPAUSAL SYNDROME 06/05/2008    Past Surgical History:  Procedure Laterality Date   ABDOMINAL HYSTERECTOMY     CESAREAN SECTION     x 2   COLONOSCOPY  2005   CYSTECTOMY     left elbow   LAPAROSCOPIC OVARIAN CYSTECTOMY      Social History:  reports that she has never smoked. She has never used smokeless tobacco. She reports that she does not drink alcohol and does not use drugs.  Allergies: No Known Allergies  Family History:  Family History  Problem Relation Age of Onset   Heart disease Father    Cancer Sister    Colon cancer Neg Hx    Breast cancer Neg Hx      Current Outpatient Medications:    atorvastatin  (LIPITOR) 40 MG tablet, TAKE 1 TABLET BY MOUTH EVERY DAY, Disp: 90 tablet, Rfl: 0   cholecalciferol (VITAMIN D ) 1000 UNITS tablet, Take 1,000 Units by mouth daily., Disp: , Rfl:    cromolyn (OPTICROM) 4 % ophthalmic solution, 1 drop 4 (four) times daily., Disp: , Rfl:    cyanocobalamin  (VITAMIN B12) 1000 MCG tablet, Take 1,000 mcg by mouth daily., Disp: , Rfl:    hydrochlorothiazide  (HYDRODIURIL ) 25 MG tablet, TAKE 1 TABLET BY MOUTH EVERY DAY, Disp: 90 tablet, Rfl: 0   lisinopril  (ZESTRIL ) 10 MG tablet, TAKE 1 TABLET BY MOUTH EVERY DAY, Disp: 90 tablet, Rfl: 0   magnesium 30 MG tablet, Take 30 mg by mouth daily., Disp: , Rfl:     metFORMIN  (GLUCOPHAGE ) 500 MG tablet, TAKE 1 TABLET BY MOUTH EVERYDAY AT BEDTIME, Disp: 90 tablet, Rfl: 0   Multiple Vitamin (MULTIVITAMIN) tablet, Take 1 tablet by mouth daily., Disp: , Rfl:   Review of Systems:  Negative unless indicated in HPI.   Physical Exam: Vitals:   02/29/24 0819 02/29/24 0828  BP: 130/80 128/78  Temp: (!) 97.5 F (36.4 C)   TempSrc: Oral   Weight: 163 lb 12.8 oz (74.3 kg)     Body mass index is 26.44 kg/m.   Physical Exam Vitals reviewed.  Constitutional:      Appearance: Normal appearance.  HENT:     Head: Normocephalic and atraumatic.  Eyes:     Conjunctiva/sclera: Conjunctivae normal.  Cardiovascular:     Rate and Rhythm: Normal rate and regular rhythm.  Pulmonary:     Effort: Pulmonary effort is normal.     Breath sounds: Normal breath sounds.  Skin:    General: Skin is warm and dry.  Neurological:     General: No focal deficit present.     Mental Status: She is alert and oriented to person, place, and time.  Psychiatric:        Mood and Affect: Mood normal.        Behavior: Behavior normal.        Thought  Content: Thought content normal.        Judgment: Judgment normal.      Impression and Plan:  Type 2 diabetes mellitus with other specified complication, without long-term current use of insulin (HCC) Assessment & Plan: Well controlled with an A1c of 6.0.  Orders: -     POCT glycosylated hemoglobin (Hb A1C)  Microalbuminuria due to type 2 diabetes mellitus (HCC) -     Microalbumin / creatinine urine ratio; Future  Hypertension associated with diabetes [facility]) Assessment & Plan: Well-controlled on current.   Hyperlipidemia associated with type 2 diabetes mellitus [facility]) Assessment & Plan: LDL at goal at 64 at last check in October 2024.  Continue atorvastatin  40 mg daily.   Immunization due   - Flu vaccine given in office today  Time spent:31 minutes reviewing chart, interviewing and examining patient and  formulating plan of care.     Tully Theophilus Andrews, MD [facility]

## 2024-02-29 NOTE — Assessment & Plan Note (Signed)
 On lisinopril .  Check microalbumin.

## 2024-02-29 NOTE — Addendum Note (Signed)
 Addended by: KATHRYNE MILLMAN B on: 02/29/2024 02:14 PM   Modules accepted: Orders

## 2024-04-17 ENCOUNTER — Other Ambulatory Visit: Payer: Self-pay | Admitting: Internal Medicine

## 2024-04-17 DIAGNOSIS — E785 Hyperlipidemia, unspecified: Secondary | ICD-10-CM

## 2024-04-23 ENCOUNTER — Other Ambulatory Visit: Payer: Self-pay | Admitting: Internal Medicine

## 2024-04-23 DIAGNOSIS — E1129 Type 2 diabetes mellitus with other diabetic kidney complication: Secondary | ICD-10-CM

## 2024-04-23 DIAGNOSIS — E1169 Type 2 diabetes mellitus with other specified complication: Secondary | ICD-10-CM

## 2024-04-24 ENCOUNTER — Other Ambulatory Visit: Payer: Self-pay | Admitting: Internal Medicine

## 2024-04-24 DIAGNOSIS — I10 Essential (primary) hypertension: Secondary | ICD-10-CM

## 2024-05-22 ENCOUNTER — Telehealth: Payer: Self-pay | Admitting: Internal Medicine

## 2024-05-22 DIAGNOSIS — T7840XA Allergy, unspecified, initial encounter: Secondary | ICD-10-CM

## 2024-05-22 DIAGNOSIS — Z1283 Encounter for screening for malignant neoplasm of skin: Secondary | ICD-10-CM

## 2024-05-22 NOTE — Telephone Encounter (Signed)
 Referrals placed

## 2024-05-22 NOTE — Telephone Encounter (Signed)
 Copied from CRM [id number]. Topic: Referral - Request for Referral >> May 22, 2024 10:12 AM Treva T wrote: Did the patient discuss referral with their provider in the last year? Yes   Appointment offered? Yes  Type of order/referral and detailed reason for visit:  1. [facility]-mole check on upper body (appt 07/02/24)  2. ENT-hearing test, and ear nose and throat check (appt 07/03/24)  Preference of office, provider, location: [facility], [facility]  ENT-[facility], on [address]  If referral order, have you been seen by this specialty before? Yes   Can we respond through MyChart? No, pt prefers ph [phone]

## 2024-06-20 ENCOUNTER — Encounter: Admitting: Internal Medicine
